# Patient Record
Sex: Female | Born: 1950 | Race: White | Hispanic: No | Smoking: Never smoker
Health system: Southern US, Community
[De-identification: ages and names within clinical notes are randomized; demographics above are authoritative.]

## PROBLEM LIST (undated history)

## (undated) DIAGNOSIS — M797 Fibromyalgia: Secondary | ICD-10-CM

## (undated) DIAGNOSIS — E559 Vitamin D deficiency, unspecified: Secondary | ICD-10-CM

## (undated) DIAGNOSIS — M1990 Unspecified osteoarthritis, unspecified site: Secondary | ICD-10-CM

## (undated) DIAGNOSIS — E039 Hypothyroidism, unspecified: Secondary | ICD-10-CM

## (undated) DIAGNOSIS — Z780 Asymptomatic menopausal state: Secondary | ICD-10-CM

## (undated) DIAGNOSIS — E8881 Metabolic syndrome: Secondary | ICD-10-CM

## (undated) DIAGNOSIS — E2740 Unspecified adrenocortical insufficiency: Secondary | ICD-10-CM

## (undated) DIAGNOSIS — T560X1A Toxic effect of lead and its compounds, accidental (unintentional), initial encounter: Secondary | ICD-10-CM

## (undated) HISTORY — DX: Vitamin D deficiency, unspecified: E55.9

## (undated) HISTORY — DX: Fibromyalgia: M79.7

## (undated) HISTORY — DX: Toxic effect of lead and its compounds, accidental (unintentional), initial encounter: T56.0X1A

## (undated) HISTORY — DX: Hypothyroidism, unspecified: E03.9

## (undated) HISTORY — DX: Unspecified adrenocortical insufficiency: E27.40

## (undated) HISTORY — DX: Unspecified osteoarthritis, unspecified site: M19.90

## (undated) HISTORY — DX: Metabolic syndrome: E88.810

## (undated) HISTORY — DX: Asymptomatic menopausal state: Z78.0

## (undated) HISTORY — DX: Metabolic syndrome: E88.81

## (undated) HISTORY — PX: OTHER SURGICAL HISTORY: SHX169

## (undated) HISTORY — PX: TOE SURGERY: SHX1073

---

## 1979-01-12 HISTORY — PX: APPENDECTOMY: SHX54

## 1988-01-12 HISTORY — PX: OTHER SURGICAL HISTORY: SHX169

## 1991-01-12 HISTORY — PX: MYOMECTOMY: SHX85

## 1998-02-17 ENCOUNTER — Other Ambulatory Visit: Admission: RE | Admit: 1998-02-17 | Discharge: 1998-02-17 | Payer: Self-pay | Admitting: Gynecology

## 1998-07-08 ENCOUNTER — Ambulatory Visit (HOSPITAL_COMMUNITY): Admission: RE | Admit: 1998-07-08 | Discharge: 1998-07-08 | Payer: Self-pay | Admitting: Gastroenterology

## 1998-07-08 ENCOUNTER — Encounter: Payer: Self-pay | Admitting: Gastroenterology

## 1999-03-09 ENCOUNTER — Other Ambulatory Visit: Admission: RE | Admit: 1999-03-09 | Discharge: 1999-03-09 | Payer: Self-pay | Admitting: Gynecology

## 2000-04-15 ENCOUNTER — Other Ambulatory Visit: Admission: RE | Admit: 2000-04-15 | Discharge: 2000-04-15 | Payer: Self-pay | Admitting: Gynecology

## 2001-07-04 ENCOUNTER — Other Ambulatory Visit: Admission: RE | Admit: 2001-07-04 | Discharge: 2001-07-04 | Payer: Self-pay | Admitting: Gynecology

## 2002-07-30 ENCOUNTER — Other Ambulatory Visit: Admission: RE | Admit: 2002-07-30 | Discharge: 2002-07-30 | Payer: Self-pay | Admitting: Gynecology

## 2003-10-10 ENCOUNTER — Other Ambulatory Visit: Admission: RE | Admit: 2003-10-10 | Discharge: 2003-10-10 | Payer: Self-pay | Admitting: Gynecology

## 2007-01-12 HISTORY — PX: TONSILLECTOMY: SUR1361

## 2007-05-08 ENCOUNTER — Telehealth: Payer: Self-pay | Admitting: Gastroenterology

## 2007-05-24 ENCOUNTER — Ambulatory Visit: Payer: Self-pay | Admitting: Gastroenterology

## 2007-06-07 ENCOUNTER — Ambulatory Visit: Payer: Self-pay | Admitting: Gastroenterology

## 2008-10-18 ENCOUNTER — Telehealth: Payer: Self-pay | Admitting: Internal Medicine

## 2008-10-22 ENCOUNTER — Ambulatory Visit: Payer: Self-pay | Admitting: Internal Medicine

## 2011-04-22 ENCOUNTER — Encounter: Payer: Self-pay | Admitting: Gastroenterology

## 2012-12-25 LAB — HM MAMMOGRAPHY: HM MAMMO: NEGATIVE

## 2013-04-05 ENCOUNTER — Encounter: Payer: Self-pay | Admitting: *Deleted

## 2013-10-12 ENCOUNTER — Encounter: Payer: Self-pay | Admitting: Gastroenterology

## 2013-11-08 ENCOUNTER — Encounter: Payer: Self-pay | Admitting: Medical

## 2013-11-08 ENCOUNTER — Ambulatory Visit (INDEPENDENT_AMBULATORY_CARE_PROVIDER_SITE_OTHER): Payer: BC Managed Care – PPO | Admitting: Medical

## 2013-11-08 VITALS — BP 118/74 | HR 82 | Temp 98.4°F | Ht 58.5 in | Wt 146.8 lb

## 2013-11-08 DIAGNOSIS — E038 Other specified hypothyroidism: Secondary | ICD-10-CM

## 2013-11-08 DIAGNOSIS — M797 Fibromyalgia: Secondary | ICD-10-CM

## 2013-11-08 DIAGNOSIS — R5382 Chronic fatigue, unspecified: Secondary | ICD-10-CM

## 2013-11-08 DIAGNOSIS — E2740 Unspecified adrenocortical insufficiency: Secondary | ICD-10-CM

## 2013-11-08 DIAGNOSIS — E039 Hypothyroidism, unspecified: Secondary | ICD-10-CM | POA: Insufficient documentation

## 2013-11-08 LAB — TSH: TSH: 0.29 u[IU]/mL — AB (ref 0.35–4.50)

## 2013-11-08 NOTE — Progress Notes (Signed)
Pre visit review using our clinic review tool, if applicable. No additional management support is needed unless otherwise documented below in the visit note. 

## 2013-11-08 NOTE — Assessment & Plan Note (Signed)
Prior dx. I asked her to sign release of info forms and give [place] names and addresses of MD so we can review labs. Pt appears stable today and reports has been feeling well.

## 2013-11-08 NOTE — Assessment & Plan Note (Signed)
Will get a tsh to establish baseline. Pt feels very well today. She is on armour thyroid.

## 2013-11-08 NOTE — Progress Notes (Signed)
Subjective:    Patient ID: Karen Proctor, female    DOB: [birth year], 63 y.o.   MRN: [id number]  HPI   Pt PMH, PSH, FH, Surgery Hx and Social HX reviewed  Pt was attending [facility] most recently. Before then some doctors who changed their practice arrangements so pt needed to find new office. Pt wants to see Dr. Randel Pigg.  Most recently pt had issue with amour thyroid difficulty getting filled. But she has script and is good with that.  No acute problems today.  Pt is about to move to the [place] 90% of the time she will be living there. She wants office that she can come to couple of times a year when she is in town. Pt husband from the [place]. Pt is leaving January 23rd and the coming back 6 months- a year.  Pt states fibromyalgia is much better since getting off of gluten.   Pt states her chronic fatigue is controlled.  Pt states last time had blood work was this past spring.  Pt does not know how or if she will be have any care in [place].  D. Talbert Nan and Sharen Heck is friends that referred her here.  Dr. Benjamine Mola. [facility] MD who pt has  longest established relationship.   . Past Medical History  Diagnosis Date  . Hypothyroidism   . Postmenopausal   . Fibromyalgia   . Metabolic syndrome   . Adrenal insufficiency   . Lead toxicity   . Vitamin D deficiency     History   Social History  . Marital Status: Married    Spouse Name: N/A    Number of Children: N/A  . Years of Education: N/A   Occupational History  . Not on file.   Social History Main Topics  . Smoking status: Never Smoker   . Smokeless tobacco: Not on file  . Alcohol Use: No  . Drug Use: No  . Sexual Activity: Not on file   Other Topics Concern  . Not on file   Social History Narrative  . No narrative on file    Past Surgical History  Procedure Laterality Date  . Tonsillectomy  2009  . Appendectomy  1981  . Other surgical history  1990   surgery for endometriosis and removal of fibroid tumor.  . Fibroid tumor      Family History  Problem Relation Age of Onset  . COPD Mother 23    No Known Allergies  Current Outpatient Prescriptions on File Prior to Visit  Medication Sig Dispense Refill  . thyroid (ARMOUR) 30 MG tablet Take 30 mg by mouth daily before breakfast.      . Arginine 1000 MG TABS Take by mouth.      . BORON PO Take by mouth.      . Coenzyme Q10 (CO Q 10) 100 MG CAPS Take by mouth.      . estradiol (VIVELLE-DOT) 0.075 MG/24HR Place 1 patch onto the skin 2 (two) times a week.      Marland Kitchen liothyronine (CYTOMEL) 5 MCG tablet Take 5 mcg by mouth daily.      Marland Kitchen POTASSIUM PO Take by mouth.      Marland Kitchen PROGESTERONE MICRONIZED PO Take 50 mg by mouth at bedtime.      . valACYclovir (VALTREX) 500 MG tablet Take 500 mg by mouth 2 (two) times daily.       No current facility-administered medications on file prior to visit.  BP 118/74  Pulse 82  Temp(Src) 98.4 F (36.9 C) (Oral)  Ht 4' 10.5" (1.486 m)  Wt 146 lb 12.8 oz (66.588 kg)  BMI 30.15 kg/m2  SpO2 100%          Review of Systems  Constitutional: Negative for fever, chills and fatigue.       Energy level good recently.  HENT: Negative.   Respiratory: Negative for cough, choking, shortness of breath and wheezing.   Cardiovascular: Negative for chest pain and palpitations.  Gastrointestinal: Negative.   Genitourinary: Negative for dysuria, urgency, frequency, flank pain, difficulty urinating and menstrual problem.  Musculoskeletal: Negative for back pain, myalgias and neck stiffness.       Muscle pains controlled recently.  Skin: Negative.   Neurological: Negative.   Hematological: Negative for adenopathy. Does not bruise/bleed easily.  Psychiatric/Behavioral: Negative.        No mood issues reported today.       Objective:   Physical Exam  Constitutional: She is oriented to person, place, and time. She appears well-developed and well-nourished.  No distress.  HENT:  Head: Normocephalic and atraumatic.  Right Ear: External ear normal.  Left Ear: External ear normal.  Nose: Nose normal.  Mouth/Throat: No oropharyngeal exudate.  Eyes: Conjunctivae and EOM are normal. Pupils are equal, round, and reactive to light.  Neck: Normal range of motion. Neck supple. No JVD present. No tracheal deviation present. No thyromegaly present.  Cardiovascular: Normal rate, regular rhythm and normal heart sounds.  Exam reveals no gallop and no friction rub.   No murmur heard. Pulmonary/Chest: Effort normal and breath sounds normal. No stridor. No respiratory distress. She has no wheezes. She has no rales. She exhibits no tenderness.  Abdominal: Soft. Bowel sounds are normal. She exhibits no distension and no mass. There is no tenderness. There is no rebound and no guarding.  Lymphadenopathy:    She has no cervical adenopathy.  Neurological: She is alert and oriented to person, place, and time. No cranial nerve deficit. Coordination normal.  Skin: She is not diaphoretic.  Psychiatric: She has a normal mood and affect. Her behavior is normal. Judgment and thought content normal.             Assessment & Plan:

## 2013-11-08 NOTE — Assessment & Plan Note (Signed)
Doing well presently.

## 2013-11-08 NOTE — Patient Instructions (Addendum)
If you could please get [place] your last labs done and we will do tsh today to get a baseline. I would recommend you establish some care in the [place].   I did  talk with Dr. Randel Pigg to confirm that we will be your local primary care provider. She agrees to get established care there([place]) as well.  I would ask that you schedule appointments in advance about 2-3 months that way you could see Dr. Randel Pigg when you are in [place].  I also did ask pt to sign release forms from prior MD so we can get all records.  I did talk with Dr. Etter Sjogren and Dr. Randel Pigg regarding logistics in patient care. They both agreed pt does need to be established in [place] as well.(And we could see her when in town)

## 2013-11-14 ENCOUNTER — Telehealth: Payer: Self-pay | Admitting: Cardiovascular Disease

## 2013-11-14 NOTE — Telephone Encounter (Signed)
Pt Signed [facility] to Have All Records Copied and Mailed to Her, She Is requesting All her records I have Faxed  The Release Over to [facility] @ [phone] ( See red Plastic folder )     11.4.15/km

## 2013-11-15 ENCOUNTER — Telehealth: Payer: Self-pay | Admitting: *Deleted

## 2013-11-15 DIAGNOSIS — M1990 Unspecified osteoarthritis, unspecified site: Secondary | ICD-10-CM

## 2013-11-15 DIAGNOSIS — E8881 Metabolic syndrome: Secondary | ICD-10-CM | POA: Insufficient documentation

## 2013-11-15 DIAGNOSIS — T560X1A Toxic effect of lead and its compounds, accidental (unintentional), initial encounter: Secondary | ICD-10-CM | POA: Insufficient documentation

## 2013-11-15 DIAGNOSIS — M2550 Pain in unspecified joint: Secondary | ICD-10-CM | POA: Insufficient documentation

## 2013-11-15 DIAGNOSIS — E7800 Pure hypercholesterolemia, unspecified: Secondary | ICD-10-CM | POA: Insufficient documentation

## 2013-11-15 DIAGNOSIS — F4311 Post-traumatic stress disorder, acute: Secondary | ICD-10-CM | POA: Insufficient documentation

## 2013-11-15 DIAGNOSIS — E618 Deficiency of other specified nutrient elements: Secondary | ICD-10-CM | POA: Insufficient documentation

## 2013-11-15 DIAGNOSIS — K900 Celiac disease: Secondary | ICD-10-CM | POA: Insufficient documentation

## 2013-11-15 DIAGNOSIS — E559 Vitamin D deficiency, unspecified: Secondary | ICD-10-CM | POA: Insufficient documentation

## 2013-11-15 DIAGNOSIS — B009 Herpesviral infection, unspecified: Secondary | ICD-10-CM | POA: Insufficient documentation

## 2013-11-15 HISTORY — DX: Unspecified osteoarthritis, unspecified site: M19.90

## 2013-11-15 NOTE — Telephone Encounter (Signed)
Medical records received via fax from [facility]. Records forwarded to [facility]. JG//CMA

## 2013-11-19 ENCOUNTER — Encounter: Payer: Self-pay | Admitting: Medical

## 2013-12-13 ENCOUNTER — Encounter: Payer: Self-pay | Admitting: Medical

## 2013-12-17 ENCOUNTER — Telehealth: Payer: Self-pay | Admitting: *Deleted

## 2013-12-17 NOTE — Telephone Encounter (Signed)
Medical records received from Dr. Benjamine Mola [facility]. Records forwarded to [facility]. JG//CMA

## 2013-12-17 NOTE — Telephone Encounter (Signed)
I have updated your immunization records. Please let us know if we can help with anything else.

## 2013-12-17 NOTE — Telephone Encounter (Signed)
Received medical records via mail from Dr. Randa Ngo [facility]). Records forwarded to [facility]. JG//CMA

## 2014-01-14 ENCOUNTER — Ambulatory Visit (INDEPENDENT_AMBULATORY_CARE_PROVIDER_SITE_OTHER): Payer: BC Managed Care – PPO

## 2014-01-14 ENCOUNTER — Telehealth: Payer: Self-pay | Admitting: Medical

## 2014-01-14 DIAGNOSIS — Z23 Encounter for immunization: Secondary | ICD-10-CM

## 2014-01-14 NOTE — Telephone Encounter (Signed)
Reviewed all of pt old records. Decided to scan all of records we received(If comes in these may be helpful). Since she expressed will be living in [place] and coming here occasionally. We have seen her one time.

## 2014-01-14 NOTE — Progress Notes (Signed)
Pt tolerated injections well. No signs of a reaction upon leaving the clinic.  

## 2014-01-14 NOTE — Progress Notes (Signed)
Pre visit review using our clinic review tool, if applicable. No additional management support is needed unless otherwise documented below in the visit note. 

## 2014-01-30 ENCOUNTER — Encounter: Payer: Self-pay | Admitting: Cardiology

## 2016-12-01 ENCOUNTER — Encounter: Payer: Self-pay | Admitting: Family Medicine

## 2016-12-01 ENCOUNTER — Other Ambulatory Visit: Payer: Self-pay

## 2016-12-01 ENCOUNTER — Ambulatory Visit (INDEPENDENT_AMBULATORY_CARE_PROVIDER_SITE_OTHER): Payer: Medicare Other | Admitting: Family Medicine

## 2016-12-01 VITALS — BP 106/70 | HR 98 | Temp 98.6°F | Resp 17 | Ht 58.5 in | Wt 136.2 lb

## 2016-12-01 DIAGNOSIS — E039 Hypothyroidism, unspecified: Secondary | ICD-10-CM | POA: Diagnosis not present

## 2016-12-01 DIAGNOSIS — Z7689 Persons encountering health services in other specified circumstances: Secondary | ICD-10-CM | POA: Diagnosis not present

## 2016-12-01 DIAGNOSIS — Z5181 Encounter for therapeutic drug level monitoring: Secondary | ICD-10-CM | POA: Diagnosis not present

## 2016-12-01 NOTE — Patient Instructions (Addendum)
     IF you received an x-ray today, you will receive an invoice from [facility]. Please contact [facility] at [phone] with questions or concerns regarding your invoice.   IF you received labwork today, you will receive an invoice from [facility]. Please contact [facility] at [phone] with questions or concerns regarding your invoice.   Our billing staff will not be able to assist you with questions regarding bills from these companies.  You will be contacted with the lab results as soon as they are available. The fastest way to get your results is to activate your My Chart account. Instructions are located on the last page of this paperwork. If you have not heard from us regarding the results in 2 weeks, please contact this office.      Hypothyroidism Hypothyroidism is a disorder of the thyroid. The thyroid is a large gland that is located in the lower front of the neck. The thyroid releases hormones that control how the body works. With hypothyroidism, the thyroid does not make enough of these hormones. What are the causes? Causes of hypothyroidism may include:  Viral infections.  Pregnancy.  Your own defense system (immune system) attacking your thyroid.  Certain medicines.  Birth defects.  Past radiation treatments to your head or neck.  Past treatment with radioactive iodine.  Past surgical removal of part or all of your thyroid.  Problems with the gland that is located in the center of your brain (pituitary). What are the signs or symptoms? Signs and symptoms of hypothyroidism may include:  Feeling as though you have no energy (lethargy).  Inability to tolerate cold.  Weight gain that is not explained by a change in diet or exercise habits.  Dry skin.  Coarse hair.  Menstrual irregularity.  Slowing of thought processes.  Constipation.  Sadness or depression. How is this diagnosed? Your health care provider may diagnose  hypothyroidism with blood tests and ultrasound tests. How is this treated? Hypothyroidism is treated with medicine that replaces the hormones that your body does not make. After you begin treatment, it may take several weeks for symptoms to go away. Follow these instructions at home:  Take medicines only as directed by your health care provider.  If you start taking any new medicines, tell your health care provider.  Keep all follow-up visits as directed by your health care provider. This is important. As your condition improves, your dosage needs may change. You will need to have blood tests regularly so that your health care provider can watch your condition. Contact a health care provider if:  Your symptoms do not get better with treatment.  You are taking thyroid replacement medicine and:  You sweat excessively.  You have tremors.  You feel anxious.  You lose weight rapidly.  You cannot tolerate heat.  You have emotional swings.  You have diarrhea.  You feel weak. Get help right away if:  You develop chest pain.  You develop an irregular heartbeat.  You develop a rapid heartbeat. This information is not intended to replace advice given to you by your health care provider. Make sure you discuss any questions you have with your health care provider. Document Released: 12/28/2004 Document Revised: 06/05/2015 Document Reviewed: 05/15/2013 Elsevier Interactive Patient Education  2017 Elsevier Inc.  

## 2016-12-01 NOTE — Progress Notes (Signed)
Chief Complaint  Patient presents with  . New Patient (Initial Visit)    establish care, pt lived in the [place], visits family months at a time, needs to talk with her about when if you will need medications or vaccines while here in the  [place]    HPI  Pt reports that she has been living in the [place] but she grew up in [place] She states that she would like to establish care here and is signed up with Medicare She reports that she is her for 1-3 months at a time Her father is 31 and his wife had a stroke and her brother is hospice with COPD  Hypothyroidism: Patient presents for evaluation of thyroid function. Symptoms consist of denies fatigue, weight changes, heat/cold intolerance, bowel/skin changes or CVS symptoms. The problem has been stable.  Previous thyroid studies include TSH. The hypothyroidism is due to hypothyroidism.   4 review of systems  Past Medical History:  Diagnosis Date  . Adrenal insufficiency ([place])   . Fibromyalgia   . Hypothyroidism   . Inflammatory arthritis 11/15/2013  . Lead toxicity   . Metabolic syndrome   . Postmenopausal   . Vitamin D deficiency     Current Outpatient Medications  Medication Sig Dispense Refill  . Cholecalciferol (D3 HIGH POTENCY) 1000 units capsule Take 1,000 Units by mouth daily.    . Folic Acid-Vit V4-UJW J19 (FOLBEE) 2.5-25-1 MG TABS tablet Take 1 tablet by mouth daily.    Marland Kitchen liothyronine (CYTOMEL) 5 MCG tablet Take 5 mcg by mouth daily.    . Multiple Vitamin (MULTIVITAMIN) tablet Take 1 tablet by mouth daily.    . Multiple Vitamins-Minerals (ZINC PO) Take 5 mg by mouth once.    . thyroid (ARMOUR) 30 MG tablet Take 30 mg by mouth daily before breakfast.    . Arginine 1000 MG TABS Take by mouth.    Marland Kitchen aspirin 81 MG chewable tablet Chew by mouth daily.    Marland Kitchen BORON PO Take by mouth.    . Coenzyme Q10 (CO Q 10) 100 MG CAPS Take by mouth.    . estradiol (VIVELLE-DOT) 0.075 MG/24HR Place 1 patch onto the skin 2  (two) times a week.    Marland Kitchen POTASSIUM PO Take by mouth.    Marland Kitchen PROGESTERONE MICRONIZED PO Take 50 mg by mouth at bedtime.    . valACYclovir (VALTREX) 500 MG tablet Take 500 mg by mouth 2 (two) times daily.     No current facility-administered medications for this visit.     Allergies: No Known Allergies  Past Surgical History:  Procedure Laterality Date  . APPENDECTOMY  1981  . fibroid tumor    . MYOMECTOMY  1993  . OTHER SURGICAL HISTORY  1990   surgery for endometriosis and removal of fibroid tumor.  . TONSILLECTOMY  2009    Social History   Socioeconomic History  . Marital status: Married    Spouse name: None  . Number of children: None  . Years of education: None  . Highest education level: None  Social Needs  . Financial resource strain: None  . Food insecurity - worry: None  . Food insecurity - inability: None  . Transportation needs - medical: None  . Transportation needs - non-medical: None  Occupational History  . Occupation: Retired  Tobacco Use  . Smoking status: Never Smoker  . Smokeless tobacco: Never Used  Substance and Sexual Activity  . Alcohol use: Yes    Alcohol/week: 0.0 oz  Comment: very occasionally  . Drug use: No  . Sexual activity: None  Other Topics Concern  . None  Social History Narrative  . None    Family History  Problem Relation Age of Onset  . COPD Mother 77  . Skin cancer Father      ROS Review of Systems See HPI Constitution: No fevers or chills No malaise No diaphoresis Skin: No rash or itching Eyes: no blurry vision, no double vision GU: no dysuria or hematuria Neuro: no dizziness or headaches all others reviewed and negative   Objective: Vitals:   12/01/16 1510  BP: 106/70  Pulse: 98  Resp: 17  Temp: 98.6 F (37 C)  SpO2: 98%  Weight: 136 lb 3.2 oz (61.8 kg)  Height: 4' 10.5" (1.486 m)    Physical Exam  Constitutional: She is oriented to person, place, and time. She appears well-developed and  well-nourished.  HENT:  Head: Normocephalic and atraumatic.  Neck: Normal range of motion. No thyromegaly present.  Cardiovascular: Normal rate, regular rhythm and normal heart sounds.  No murmur heard. Pulmonary/Chest: Effort normal and breath sounds normal. No respiratory distress.  Neurological: She is alert and oriented to person, place, and time.  Reflex Scores:      Patellar reflexes are 2+ on the right side and 2+ on the left side. Skin: Skin is warm. Capillary refill takes less than 2 seconds.  Psychiatric: She has a normal mood and affect. Her behavior is normal. Judgment and thought content normal.    Assessment and Plan Jalesa was seen today for new patient (initial visit).  Diagnoses and all orders for this visit:  Acquired hypothyroidism- will check levels Will refill based on levels Stable currently -     Comprehensive metabolic panel -     TSH + free T4  Encounter to establish care with new doctor -     Comprehensive metabolic panel -     TSH + free T4  Encounter for medication monitoring- will check levels     Zoe A Nolon Rod

## 2016-12-02 LAB — COMPREHENSIVE METABOLIC PANEL
ALK PHOS: 80 IU/L (ref 39–117)
ALT: 18 IU/L (ref 0–32)
AST: 18 IU/L (ref 0–40)
Albumin/Globulin Ratio: 1.9 (ref 1.2–2.2)
Albumin: 4.5 g/dL (ref 3.6–4.8)
BUN/Creatinine Ratio: 20 (ref 12–28)
BUN: 16 mg/dL (ref 8–27)
Bilirubin Total: 0.3 mg/dL (ref 0.0–1.2)
CO2: 24 mmol/L (ref 20–29)
Calcium: 9.5 mg/dL (ref 8.7–10.3)
Chloride: 103 mmol/L (ref 96–106)
Creatinine, Ser: 0.79 mg/dL (ref 0.57–1.00)
GFR calc Af Amer: 91 mL/min/{1.73_m2} (ref >59)
GFR calc non Af Amer: 79 mL/min/{1.73_m2} (ref >59)
GLOBULIN, TOTAL: 2.4 g/dL (ref 1.5–4.5)
Glucose: 91 mg/dL (ref 65–99)
Potassium: 4.3 mmol/L (ref 3.5–5.2)
Sodium: 142 mmol/L (ref 134–144)
Total Protein: 6.9 g/dL (ref 6.0–8.5)

## 2016-12-02 LAB — TSH+FREE T4
FREE T4: 1.55 ng/dL (ref 0.82–1.77)
TSH: 0.061 u[IU]/mL — AB (ref 0.450–4.500)

## 2016-12-03 ENCOUNTER — Telehealth: Payer: Self-pay | Admitting: Family Medicine

## 2016-12-03 DIAGNOSIS — E0580 Other thyrotoxicosis without thyrotoxic crisis or storm: Secondary | ICD-10-CM

## 2016-12-03 NOTE — Telephone Encounter (Signed)
Patient reports that her travel plans are subject to change ans so she will be in [place] longer but uncertain when she will return to the [place]. Patient made aware of her low TSH  She is on Levothyroxine and Cytomel She has Endocrinologist in the [place].  Referred her to Endocrinologist locally.

## 2016-12-03 NOTE — Telephone Encounter (Signed)
Spoke with patient- she wants to know if her dose is changing and to make sure that she can get a 1 year supply because she travels to [place]. [facility] uses [facility] [address].

## 2016-12-03 NOTE — Telephone Encounter (Signed)
Copied from [place]. Topic: Inquiry >> Dec 03, 2016  9:54 AM Karen Proctor, [place] wrote: Reason for CRM: Patient called to check on her lab work that she had done. Patient also had a question about her year supply of her Liothyronine medication that she had spoken with the doctor about. If someone could give her a call back at [phone] (current cell number)

## 2016-12-06 NOTE — Telephone Encounter (Signed)
Addressed in a separate phone note.

## 2016-12-06 NOTE — Telephone Encounter (Signed)
Please advise 

## 2016-12-13 ENCOUNTER — Ambulatory Visit: Payer: Medicare Other

## 2017-01-31 ENCOUNTER — Ambulatory Visit: Payer: BC Managed Care – PPO | Admitting: Endocrinology

## 2017-03-03 DIAGNOSIS — Z23 Encounter for immunization: Secondary | ICD-10-CM | POA: Diagnosis not present

## 2017-05-15 ENCOUNTER — Encounter: Payer: Self-pay | Admitting: Gastroenterology

## 2017-09-29 DIAGNOSIS — Z23 Encounter for immunization: Secondary | ICD-10-CM | POA: Diagnosis not present

## 2017-10-06 ENCOUNTER — Telehealth: Payer: Self-pay

## 2017-10-06 NOTE — Telephone Encounter (Signed)
Per Dr. Charlett Blake she is currently not accepting new patients at this time she can recommend Dr. Lorelei Pont is accepting new patients

## 2017-10-06 NOTE — Telephone Encounter (Signed)
Copied from [place] [id number]. Topic: Inquiry >> Oct 06, 2017  9:28 AM Sheran Luz wrote: Reason for CRM: Karen Proctor is requesting Dr. Charlett Blake accept her as a new pt if possible stating that she has had friends who recommended her. Advised pt that Dr. Charlett Blake is not approving new patients at this time but pt wanted to note that she lives part time in [place] and comes to [place] for 4-6 months at a time. Pt cannot bring her medication for Hypothyroidism, as it needs to be refrigerated, so pt states that she needs a "part time pcp" in [place] and that her doctor in the [place] is willing to [employer] notes. Please advise.   Cb# [phone]

## 2017-10-07 NOTE — Telephone Encounter (Signed)
Pt knew Dr. Charlett Blake wasn't accepting patients and said she was a previous patient of Mackie Pai and wanted to reestablish care with him. Scheduled appt 10/11/17 @ 10:40.

## 2017-10-11 ENCOUNTER — Encounter: Payer: Self-pay | Admitting: Gastroenterology

## 2017-10-11 ENCOUNTER — Encounter: Payer: Self-pay | Admitting: Medical

## 2017-10-11 ENCOUNTER — Ambulatory Visit (INDEPENDENT_AMBULATORY_CARE_PROVIDER_SITE_OTHER): Payer: Medicare Other | Admitting: Medical

## 2017-10-11 VITALS — BP 130/80 | HR 69 | Temp 97.8°F | Resp 16 | Ht 58.5 in | Wt 143.2 lb

## 2017-10-11 DIAGNOSIS — E039 Hypothyroidism, unspecified: Secondary | ICD-10-CM

## 2017-10-11 DIAGNOSIS — Z1211 Encounter for screening for malignant neoplasm of colon: Secondary | ICD-10-CM

## 2017-10-11 LAB — T4, FREE: FREE T4: 1.13 ng/dL (ref 0.60–1.60)

## 2017-10-11 LAB — TSH: TSH: 0.04 u[IU]/mL — ABNORMAL LOW (ref 0.35–4.50)

## 2017-10-11 MED ORDER — CYCLOBENZAPRINE HCL 5 MG PO TABS: 5.0000 mg | ORAL_TABLET | Freq: Three times a day (TID) | ORAL | 0 refills | Status: DC | PRN

## 2017-10-11 NOTE — Patient Instructions (Addendum)
For history of hypothyroidism, I am going to repeat her TSH and T4 level.  Knowing these will allow me to determine what is the most appropriate course of action in terms of medication dosage.  It is helpful that she brought lab work that was done in the [place] and the letter from your endocrinologist.  If some abnormal values will touch base with supervising physicians to get their opinion.  I do plan to touch base with you by tomorrow afternoon or at the latest by Thursday.  I typically labs will come back within 24 hours.  Follow-up date to be determined based on lab work.  Also at your request did go ahead and make a GI referral for screening colonoscopy.

## 2017-10-11 NOTE — Progress Notes (Addendum)
Subjective:    Patient ID: Karen Proctor, female    DOB: [birth year], 67 y.o.   MRN: [id number]  HPI  Pt in to get re-established/new patient appointment. Last seen by myself in Oct 2015. She has been seeing 2 MD over past 4 years.(LOS will be new pt)  Pt has been in [place] for about 3 years.   Pt is on both synthroid and cytomel. Pt states that recent former pcp told her she was hyperthyroid or over supplemented. Pt sees endocrinologist in [place]  who typed letter explaining she is hypothyroid.We have some copies of former labs  Recent former pcp wanted to refer her to specialist in [place]. So she left and came here. Former pt here about 3 years ago. I only saw her one time but she has  MD's as her pcp in [place] since.  Pt lives in [place] primarily but at times can live in [place] up to 6 months( for example when her brother became ill). Pt may be here for only 2 months but if her dad or mom health condition worsens then she may stay much longer.  Pt states in [place] she only has one form of cytomel that needs to refrigerated.  She is using  currently old generic liothyronine that was former pcp in [place] had given.   Pt was seeing Dr. Deirdre Pippins who wrote prior prescription cytomel.(pt takes 5 mcg daily) but she wrote script that stated  twice a day so she won't run out of the cytomel. She explains if has to travel the refrrigerated version becomes useless.   Pt states only takes both thyroid med 5 days a week.  Pt will be here in [place] until November.   Review of Systems  Constitutional: Negative for activity change, chills and fatigue.  Respiratory: Negative for shortness of breath.   Cardiovascular: Negative for chest pain and palpitations.  Gastrointestinal: Negative for abdominal pain, constipation, diarrhea, nausea and vomiting.  Endocrine: Negative for polydipsia, polyphagia and polyuria.  Musculoskeletal: Negative for back pain, myalgias and neck stiffness.  Skin:  Negative for pallor.  Hematological: Negative for adenopathy. Does not bruise/bleed easily.  Psychiatric/Behavioral: Negative for behavioral problems, confusion and suicidal ideas. The patient is nervous/anxious.        Appears nervous and stressed about her thyroid issue.     Past Medical History:  Diagnosis Date  . Adrenal insufficiency ([place])   . Fibromyalgia   . Hypothyroidism   . Inflammatory arthritis 11/15/2013  . Lead toxicity   . Metabolic syndrome   . Postmenopausal   . Vitamin D deficiency      Social History   Socioeconomic History  . Marital status: Married    Spouse name: Not on file  . Number of children: Not on file  . Years of education: Not on file  . Highest education level: Not on file  Occupational History  . Occupation: Retired  Scientific laboratory technician  . Financial resource strain: Not on file  . Food insecurity:    Worry: Not on file    Inability: Not on file  . Transportation needs:    Medical: Not on file    Non-medical: Not on file  Tobacco Use  . Smoking status: Never Smoker  . Smokeless tobacco: Never Used  Substance and Sexual Activity  . Alcohol use: Yes    Alcohol/week: 0.0 standard drinks    Comment: very occasionally  . Drug use: No  . Sexual activity: Not on file  Lifestyle  .  Physical activity:    Days per week: Not on file    Minutes per session: Not on file  . Stress: Not on file  Relationships  . Social connections:    Talks on phone: Not on file    Gets together: Not on file    Attends religious service: Not on file    Active member of club or organization: Not on file    Attends meetings of clubs or organizations: Not on file    Relationship status: Not on file  . Intimate partner violence:    Fear of current or ex partner: Not on file    Emotionally abused: Not on file    Physically abused: Not on file    Forced sexual activity: Not on file  Other Topics Concern  . Not on file  Social History Narrative  . Not on file     Past Surgical History:  Procedure Laterality Date  . APPENDECTOMY  1981  . fibroid tumor    . MYOMECTOMY  1993  . OTHER SURGICAL HISTORY  1990   surgery for endometriosis and removal of fibroid tumor.  . TONSILLECTOMY  2009    Family History  Problem Relation Age of Onset  . COPD Mother 46  . Skin cancer Father     No Known Allergies  Current Outpatient Medications on File Prior to Visit  Medication Sig Dispense Refill  . Arginine 1000 MG TABS Take by mouth.    Marland Kitchen aspirin 81 MG chewable tablet Chew by mouth daily.    Marland Kitchen levothyroxine (SYNTHROID, LEVOTHROID) 100 MCG tablet Take 100 mcg by mouth daily before breakfast.    . BORON PO Take by mouth.    . Cholecalciferol (D3 HIGH POTENCY) 1000 units capsule Take 1,000 Units by mouth daily.    . Coenzyme Q10 (CO Q 10) 100 MG CAPS Take by mouth.    . liothyronine (CYTOMEL) 5 MCG tablet Take 5 mcg by mouth daily.    . Multiple Vitamin (MULTIVITAMIN) tablet Take 1 tablet by mouth daily.    . Multiple Vitamins-Minerals (ZINC PO) Take 5 mg by mouth once.    Marland Kitchen POTASSIUM PO Take by mouth.    Marland Kitchen PROGESTERONE MICRONIZED PO Take 50 mg by mouth at bedtime.    . valACYclovir (VALTREX) 500 MG tablet Take 500 mg by mouth 2 (two) times daily.     No current facility-administered medications on file prior to visit.     BP (!) 148/83   Pulse 69   Temp 97.8 F (36.6 C) (Oral)   Resp 16   Ht 4' 10.5" (1.486 m)   Wt 143 lb 3.2 oz (65 kg)   SpO2 100%   BMI 29.42 kg/m       Objective:   Physical Exam  General Mental Status- Alert. General Appearance- Not in acute distress.     Neck Carotid Arteries- Normal color. Moisture- Normal Moisturre. No JVD. No thyromegaly.  Chest and Lung Exam Auscultation: Breath Sounds:-Normal.  Cardiovascular Auscultation:Rythm- Regular. Murmurs & Other Heart Sounds:Auscultation of the heart reveals- No Murmurs.  Neurologic Cranial Nerve exam:- CN III-XII intact(No nystagmus), symmetric  smile. Strength:- 5/5 equal and symmetric strength both upper and lower extremities.      Assessment & Plan:  For history of hypothyroidism, I am going to repeat her TSH and T4 level.  Knowing these will allow me to determine what is the most appropriate course of action in terms of medication dosage.  It is helpful that  she brought lab work that was done in the [place] and the letter from your endocrinologist.  If some abnormal values will touch base with supervising physicians to get their opinion.  I do plan to touch base with you by tomorrow afternoon or at the latest by Thursday.  I typically labs will come back within 24 hours.  Follow-up date to be determined based on lab work.  Also at your request did go ahead and make a GI referral for screening colonoscopy.  Mackie Pai, PA-C

## 2017-10-12 ENCOUNTER — Other Ambulatory Visit: Payer: Self-pay | Admitting: Family Medicine

## 2017-10-12 DIAGNOSIS — R7989 Other specified abnormal findings of blood chemistry: Secondary | ICD-10-CM

## 2017-10-13 ENCOUNTER — Telehealth: Payer: Self-pay | Admitting: Medical

## 2017-10-13 ENCOUNTER — Telehealth: Payer: Self-pay | Admitting: Family Medicine

## 2017-10-13 NOTE — Telephone Encounter (Signed)
Copied from [place] [id number]. Topic: Quick Communication - See Telephone Encounter >> Oct 13, 2017  2:11 PM Rutherford Nail, [place] wrote: CRM for notification. See Telephone encounter for: 10/13/17. Patient requesting a call back from [facility] regarding lab results and referral for endocrinology. States that endocrinology is backed up until 01/2018 and she will not be in the [place] that long. Would like to see if anyone else has anything sooner. Please advise. CB#:[phone]

## 2017-10-13 NOTE — Telephone Encounter (Signed)
Let patient know due to having to see paitent in office I am not sure when I will be able to call her. I have sent her the results via my chart. I did get a chance to talk with Dr. Charlett Blake and she is going to review my note and labs. Then give her opinion. If she also agrees with referral will go ahead and make the referral. If we do make referral then will ask staff to call around and get quickest appointment.

## 2017-10-13 NOTE — Telephone Encounter (Signed)
Made note on canceling the flexeril rx that jasmine rx'd. I advised her to cancel the rx. Told patient not to fill the rx. Explained the mistake patient and advised not to fill and jasmine is canceling the rx.

## 2017-10-14 ENCOUNTER — Telehealth: Payer: Self-pay | Admitting: Medical

## 2017-10-14 NOTE — Telephone Encounter (Signed)
Pt notified. Pt states she would greatly appreciate someone helping her out with her Thyroid problem. Pt expressed concerns about being between two different  medical systems. Pt states she doe not know how long she will be in [place].

## 2017-10-14 NOTE — Telephone Encounter (Signed)
Author called pt. to relay Edward's recent note. Pt. Stated she "already talked to [facility]" about the referral. Pt. Is concerned about her thyroid, and would need to be seen by endocrinologist before mid-November when she plans to be out of the country. Routed to [place] and [place], [place].

## 2017-10-14 NOTE — Telephone Encounter (Signed)
Pt has adequate supply for cytomel presently. She may not need refill any time soon. She will stay on same regimen presently.    Pt is ok with [place] trying to find local endocrinologist.

## 2017-11-01 ENCOUNTER — Other Ambulatory Visit: Payer: Self-pay

## 2017-11-01 ENCOUNTER — Ambulatory Visit (AMBULATORY_SURGERY_CENTER): Payer: Self-pay

## 2017-11-01 VITALS — Ht 59.0 in | Wt 142.2 lb

## 2017-11-01 DIAGNOSIS — Z1211 Encounter for screening for malignant neoplasm of colon: Secondary | ICD-10-CM

## 2017-11-01 NOTE — Progress Notes (Signed)
Pt verbalize allergies to soy products No issues with past sedation with any surgeries  or procedures, no intubation problems  No diet pills per patient No home 02 use per patient  No blood thinners per patient  Pt denies issues with constipation  No A fib or A flutter  EMMI video sent to pt's e mail

## 2017-11-14 ENCOUNTER — Encounter: Payer: Self-pay | Admitting: Endocrinology

## 2017-11-15 ENCOUNTER — Encounter: Payer: Self-pay | Admitting: Gastroenterology

## 2017-11-15 ENCOUNTER — Ambulatory Visit (AMBULATORY_SURGERY_CENTER): Payer: Medicare Other | Admitting: Gastroenterology

## 2017-11-15 VITALS — BP 110/56 | HR 91 | Temp 97.8°F | Resp 12 | Ht <= 58 in | Wt 143.0 lb

## 2017-11-15 DIAGNOSIS — Z1211 Encounter for screening for malignant neoplasm of colon: Secondary | ICD-10-CM | POA: Diagnosis not present

## 2017-11-15 DIAGNOSIS — D120 Benign neoplasm of cecum: Secondary | ICD-10-CM | POA: Diagnosis not present

## 2017-11-15 DIAGNOSIS — D123 Benign neoplasm of transverse colon: Secondary | ICD-10-CM

## 2017-11-15 MED ORDER — SODIUM CHLORIDE 0.9 % IV SOLN: 500.0000 mL | Freq: Once | INTRAVENOUS | Status: DC

## 2017-11-15 NOTE — Progress Notes (Signed)
To PACU, VSS. Report to [email or web address] 

## 2017-11-15 NOTE — Op Note (Signed)
[facility] Patient Name: Karen Proctor Procedure Date: 11/15/2017 12:57 PM MRN: [id number] Endoscopist: Mauri Pole , MD Age: 67 Referring MD:  Date of Birth: 1950-08-13 Gender: Female Account #: [id number] Procedure:                Colonoscopy Indications:              Screening for colorectal malignant neoplasm, Last                            colonoscopy: 2009 Medicines:                Monitored Anesthesia Care Procedure:                Pre-Anesthesia Assessment:                           - Prior to the procedure, a History and Physical                            was performed, and patient medications and                            allergies were reviewed. The patient's tolerance of                            previous anesthesia was also reviewed. The risks                            and benefits of the procedure and the sedation                            options and risks were discussed with the patient.                            All questions were answered, and informed consent                            was obtained. Prior Anticoagulants: The patient has                            taken no previous anticoagulant or antiplatelet                            agents. ASA Grade Assessment: II - A patient with                            mild systemic disease. After reviewing the risks                            and benefits, the patient was deemed in                            satisfactory condition to undergo the procedure.  After obtaining informed consent, the colonoscope                            was passed under direct vision. Throughout the                            procedure, the patient's blood pressure, pulse, and                            oxygen saturations were monitored continuously. The                            Colonoscope was introduced through the anus and                            advanced to the the cecum, identified  by                            appendiceal orifice and ileocecal valve. The                            colonoscopy was performed without difficulty. The                            patient tolerated the procedure well. The quality                            of the bowel preparation was excellent. The                            ileocecal valve, appendiceal orifice, and rectum                            were photographed. Scope In: 1:00:51 PM Scope Out: 1:23:18 PM Scope Withdrawal Time: 0 hours 14 minutes 32 seconds  Total Procedure Duration: 0 hours 22 minutes 27 seconds  Findings:                 The perianal and digital rectal examinations were                            normal.                           A 1 mm polyp was found in the cecum. The polyp was                            sessile. The polyp was removed with a cold biopsy                            forceps. Resection and retrieval were complete.                           A 4 mm polyp was found in the transverse colon. The  polyp was sessile. The polyp was removed with a                            cold snare. Resection and retrieval were complete.                           Non-bleeding internal hemorrhoids were found during                            retroflexion. The hemorrhoids were small.                           A few small-mouthed diverticula were found in the                            sigmoid colon. Complications:            No immediate complications. Estimated Blood Loss:     Estimated blood loss was minimal. Impression:               - One 1 mm polyp in the cecum, removed with a cold                            biopsy forceps. Resected and retrieved.                           - One 4 mm polyp in the transverse colon, removed                            with a cold snare. Resected and retrieved.                           - Non-bleeding internal hemorrhoids.                           -  Diverticulosis in the sigmoid colon. Recommendation:           - Patient has a contact number available for                            emergencies. The signs and symptoms of potential                            delayed complications were discussed with the                            patient. Return to normal activities tomorrow.                            Written discharge instructions were provided to the                            patient.                           - Resume previous diet.                           -  Continue present medications.                           - Await pathology results.                           - Repeat colonoscopy in 5-10 years for surveillance                            based on pathology results. Mauri Pole, MD 11/15/2017 1:33:41 PM This report has been signed electronically.

## 2017-11-15 NOTE — Patient Instructions (Signed)
YOU HAD AN ENDOSCOPIC PROCEDURE TODAY AT THE [facility]:   Refer to the procedure report that was given to you for any specific questions about what was found during the examination.  If the procedure report does not answer your questions, please call your gastroenterologist to clarify.  If you requested that your care partner not be given the details of your procedure findings, then the procedure report has been included in a sealed envelope for you to review at your convenience later.  YOU SHOULD EXPECT: Some feelings of bloating in the abdomen. Passage of more gas than usual.  Walking can help get rid of the air that was put into your GI tract during the procedure and reduce the bloating. If you had a lower endoscopy (such as a colonoscopy or flexible sigmoidoscopy) you may notice spotting of blood in your stool or on the toilet paper. If you underwent a bowel prep for your procedure, you may not have a normal bowel movement for a few days.  Please Note:  You might notice some irritation and congestion in your nose or some drainage.  This is from the oxygen used during your procedure.  There is no need for concern and it should clear up in a day or so.  SYMPTOMS TO REPORT IMMEDIATELY:   Following lower endoscopy (colonoscopy or flexible sigmoidoscopy):  Excessive amounts of blood in the stool  Significant tenderness or worsening of abdominal pains  Swelling of the abdomen that is new, acute  Fever of 100F or higher   For urgent or emergent issues, a gastroenterologist can be reached at any hour by calling [phone].   DIET:  We do recommend a small meal at first, but then you may proceed to your regular diet.  Drink plenty of fluids but you should avoid alcoholic beverages for 24 hours. Try to increase the fiber in your diet, and drink plenty of water.  ACTIVITY:  You should plan to take it easy for the rest of today and you should NOT DRIVE or use heavy machinery until  tomorrow (because of the sedation medicines used during the test).    FOLLOW UP: Our staff will call the number listed on your records the next business day following your procedure to check on you and address any questions or concerns that you may have regarding the information given to you following your procedure. If we do not reach you, we will leave a message.  However, if you are feeling well and you are not experiencing any problems, there is no need to return our call.  We will assume that you have returned to your regular daily activities without incident.  If any biopsies were taken you will be contacted by phone or by letter within the next 1-3 weeks.  Please call us at [phone] if you have not heard about the biopsies in 3 weeks.    SIGNATURES/CONFIDENTIALITY: You and/or your care partner have signed paperwork which will be entered into your electronic medical record.  These signatures attest to the fact that that the information above on your After Visit Summary has been reviewed and is understood.  Full responsibility of the confidentiality of this discharge information lies with you and/or your care-partner.  Read all of the handouts given to you by your recovery room nurse. 

## 2017-11-15 NOTE — Progress Notes (Signed)
Called to room to assist during endoscopic procedure.  Patient ID and intended procedure confirmed with present staff. Received instructions for my participation in the procedure from the performing physician.  

## 2017-11-16 ENCOUNTER — Telehealth: Payer: Self-pay | Admitting: *Deleted

## 2017-11-16 NOTE — Telephone Encounter (Signed)
  Follow up Call-  Call back number 11/15/2017  Post procedure Call Back phone  # [phone]  Permission to leave phone message Yes  Some recent data might be hidden     Patient questions:  Do you have a fever, pain , or abdominal swelling? No. Pain Score  0 *  Have you tolerated food without any problems? Yes.    Have you been able to return to your normal activities? Yes.    Do you have any questions about your discharge instructions: Diet   No. Medications  No. Follow up visit  No.  Do you have questions or concerns about your Care? No.  Actions: * If pain score is 4 or above: No action needed, pain <4.

## 2017-11-22 ENCOUNTER — Encounter: Payer: Self-pay | Admitting: Gastroenterology

## 2017-11-22 ENCOUNTER — Telehealth: Payer: Self-pay

## 2017-11-22 NOTE — Telephone Encounter (Signed)
Agent Abbi states pt. Wants to discuss colonoscopy results. Agent will forward the request to provider.

## 2017-11-30 ENCOUNTER — Telehealth: Payer: Self-pay | Admitting: Gastroenterology

## 2017-11-30 NOTE — Telephone Encounter (Signed)
Spoke with the patient. Many many questions about her biopsy results, the anatomy of the colon, the location of the polyp and the method of recall for the next colonoscopy. She also wanted to know about insurance coverage. Answered question. Advised her I do not know what her insurance will or will not cover when the time comes. Requests a copy of the pathology report. Mailing it out today. Confirmed her address.

## 2017-12-01 DIAGNOSIS — Z124 Encounter for screening for malignant neoplasm of cervix: Secondary | ICD-10-CM | POA: Diagnosis not present

## 2017-12-01 DIAGNOSIS — Z01419 Encounter for gynecological examination (general) (routine) without abnormal findings: Secondary | ICD-10-CM | POA: Diagnosis not present

## 2017-12-02 ENCOUNTER — Encounter: Payer: Self-pay | Admitting: Medical

## 2017-12-02 NOTE — Progress Notes (Unsigned)
Patient requested copy of labs and after visit summary  for 10/11/17 office visit. Mailed out today.

## 2017-12-11 NOTE — Progress Notes (Addendum)
Patient ID: Karen Proctor, female   DOB: 11-11-1950, 67 y.o.   MRN: [id number]           Referring Provider: Mackie Pai, PA-C  Reason for Appointment:  Hypothyroidism, new visit    History of Present Illness:   Hypothyroidism was first diagnosed in 2004  At the time of diagnosis patient was having symptoms of significant fatigue but does not remember any other symptoms typical of hypothyroidism She was also having generalized aching symptoms She was seen by a holistic physician who told her that she was hypothyroid Not clear if she had a baseline thyroid enlargement or abnormal antibody studies  She was treated with Armour Thyroid initially with improvement in her thyroid symptoms Subsequently she was also tried by another physician on levothyroxine but she felt more tired with this She thinks that for the last 8 years or so she has been taking a combination of levothyroxine and Cytomel Apparently has not had much change in her dosage for some time The patient takes the thyroid supplements before breakfast  The patient is partly followed in the [place] where she lives most of the time by a local endocrinologist  Although she thinks her energy level is generally fairly good her thyroid levels have been somewhat inconsistent She recently does not complain of any shakiness, anxiety, palpitations, unusual weight loss or heat intolerance However in the past she has had some point being given excessive thyroid hormone doses causing palpitations and chest tightness  Review of her TSH levels done in the [place] since 2018 indicate that TSH level has been low normal or low ranging between 0.11 and 0.54 TSH was also low in 8/19 but her treatment regimen was not changed Total T3 levels have been usually admit normal with T4 (?  Free)  levels upper normal  She now says that she was told in the [place] that the Cytomel she takes needs to be refrigerated However the  prescriptions he gets from here which is the usual liothyronine that she keeps at room temperature  Because of her TSH being significantly suppressed in 10/2017 she has been referred here for further management         Patient's weight history is as follows:  Wt Readings from Last 3 Encounters:  12/12/17 147 lb 3.2 oz (66.8 kg)  11/15/17 143 lb (64.9 kg)  11/01/17 142 lb 3.2 oz (64.5 kg)    Thyroid function results have been as follows:  Lab Results  Component Value Date   TSH 0.04 (L) 10/11/2017   TSH 0.061 (L) 12/01/2016   TSH 0.29 (L) 11/08/2013   FREET4 1.13 10/11/2017   FREET4 1.55 12/01/2016   No results found for: T3FREE    Past Medical History:  Diagnosis Date  . Adrenal insufficiency ([place])   . Fibromyalgia   . Hypothyroidism   . Inflammatory arthritis 11/15/2013  . Lead toxicity   . Metabolic syndrome   . Postmenopausal   . Vitamin D deficiency     Past Surgical History:  Procedure Laterality Date  . APPENDECTOMY  1981  . fibroid tumor    . MYOMECTOMY  1993  . OTHER SURGICAL HISTORY  1990   surgery for endometriosis and removal of fibroid tumor.  . TOE SURGERY    . TONSILLECTOMY  2009    Family History  Problem Relation Age of Onset  . COPD Mother 75  . Skin cancer Father   . Colon cancer Neg Hx   . Esophageal cancer  Neg Hx   . Rectal cancer Neg Hx   . Stomach cancer Neg Hx   . Thyroid disease Neg Hx     Social History:  reports that she has never smoked. She has never used smokeless tobacco. She reports that she drinks alcohol. She reports that she does not use drugs.  Allergies:  Allergies  Allergen Reactions  . Gluten Meal     Allergies as of 12/12/2017      Reactions   Gluten Meal       Medication List        Accurate as of 12/12/17  9:17 PM. Always use your most recent med list.          aspirin 81 MG chewable tablet Chew by mouth daily.   levothyroxine 100 MCG tablet Commonly known as:  SYNTHROID, LEVOTHROID Take 100 mcg  by mouth daily before breakfast.   liothyronine 5 MCG tablet Commonly known as:  CYTOMEL Take 5 mcg by mouth daily.          Review of Systems  Constitutional: Negative for weight loss.       Has occasional sweats at night   HENT: Negative for hoarseness and trouble swallowing.   Respiratory: Negative for daytime sleepiness.   Cardiovascular: Negative for palpitations.  Gastrointestinal: Negative for diarrhea.       She has been told to have gluten intolerance and avoid gluten products  Endocrine: Negative for fatigue.  Genitourinary: Negative for frequency.  Musculoskeletal:       Some muscle aches present, no joint pain.  She reportedly has fibromyalgia, currently not on any specific treatment  Skin:       Periodically has had hair loss, not recently.  Make a dry skin and winter will get dry skin in winter but usually in a warm indoor environment  Neurological: Negative for weakness.  Psychiatric/Behavioral: Negative for nervousness.                Examination:    BP 126/70 (BP Location: Left Arm, Patient Position: Sitting, Cuff Size: Normal)   Pulse 91   Ht 4\' 10"  (1.473 m)   Wt 147 lb 3.2 oz (66.8 kg)   SpO2 98%   BMI 30.76 kg/m   GENERAL:  Average build.   No pallor.    Skin:  no rash or skin lesions   EYES:  No prominence of the eyes or swelling of the eyelids  ENT: Oral mucosa and tongue normal.  NECK: No lymphadenopathy  THYROID:  Not palpable.  HEART:  Normal  S1 and S2; no murmur or click.  CHEST:    Lungs: Vescicular breath sounds heard equally.  No crepitations/ wheeze.  ABDOMEN:  No distention.  Liver and spleen not palpable.  No other mass or tenderness.  NEUROLOGICAL: Reflexes are bilaterally slightly brisk at biceps but normal at ankles.  EXTREMITIES:  Normal peripheral joints.  No ankle edema present   Assessment:  HYPOTHYROIDISM, long-standing and presumably primary No associated goiter at present She has been treated  long-term with the combination of levothyroxine and liothyronine with good results and improvement of her symptoms particularly fatigue She reports not feeling as good with levothyroxine alone She takes generic levothyroxine and liothyronine although may be getting a different manufacturer in [place] where she lives most of the time Also discussed that there is no need to refrigerate liothyronine or Cytomel and not clear why she was told to do this in [place]  Although she is subjectively  doing well and is not having any current symptoms of hyperthyroidism or hypothyroidism and her exam indicates she is euthyroid she tends to have low TSH levels   PLAN:   Discussed with the patient that long-term excessive suppression of TSH would be detrimental for bone health and increased risk for atrial arrhythmias Will need to recheck her thyroid levels including free T4 and free T3  If her TSH is again low along with normal T3 will need to reduce the dose of her levothyroxine She will come for her labs on Friday since she has not taken her medications this morning  Follow-up to be determined   Elayne Snare 12/12/2017, 9:17 PM   Consultation note copy sent to the PCP  Note: This office note was prepared with Dragon voice recognition system technology. Any transcriptional errors that result from this process are unintentional.  Addendum: All labs ok, no change in Rx needed  Lab Results  Component Value Date   TSH 1.07 12/15/2017     12/15/17

## 2017-12-12 ENCOUNTER — Encounter: Payer: Self-pay | Admitting: Endocrinology

## 2017-12-12 ENCOUNTER — Ambulatory Visit (INDEPENDENT_AMBULATORY_CARE_PROVIDER_SITE_OTHER): Payer: Medicare Other | Admitting: Endocrinology

## 2017-12-12 VITALS — BP 126/70 | HR 91 | Ht <= 58 in | Wt 147.2 lb

## 2017-12-12 DIAGNOSIS — E039 Hypothyroidism, unspecified: Secondary | ICD-10-CM

## 2017-12-15 ENCOUNTER — Other Ambulatory Visit (INDEPENDENT_AMBULATORY_CARE_PROVIDER_SITE_OTHER): Payer: Medicare Other

## 2017-12-15 DIAGNOSIS — E039 Hypothyroidism, unspecified: Secondary | ICD-10-CM

## 2017-12-15 LAB — TSH: TSH: 1.07 u[IU]/mL (ref 0.35–4.50)

## 2017-12-15 LAB — T3, FREE: T3 FREE: 3 pg/mL (ref 2.3–4.2)

## 2017-12-15 LAB — T4, FREE: Free T4: 0.69 ng/dL (ref 0.60–1.60)

## 2017-12-16 ENCOUNTER — Other Ambulatory Visit: Payer: Self-pay

## 2017-12-16 MED ORDER — LIOTHYRONINE SODIUM 5 MCG PO TABS: 5.0000 ug | ORAL_TABLET | Freq: Every day | ORAL | 9 refills | Status: DC

## 2017-12-16 MED ORDER — LEVOTHYROXINE SODIUM 100 MCG PO TABS: 100.0000 ug | ORAL_TABLET | Freq: Every day | ORAL | 9 refills | Status: DC

## 2018-03-16 ENCOUNTER — Telehealth: Payer: Self-pay

## 2018-03-16 NOTE — Telephone Encounter (Signed)
Unable to contact the patient at this number, as the office is not capable of making international calls. Attempted to call pt's cellphone number listed in the chart and the brother (also the POA) answered. Brother requested that she be emailed, but was informed that we cannot send patient emails through personal work email accounts. A MyChart Message was sent to the patient in attempts to reach her and answer any questions she may have.

## 2018-03-16 NOTE — Telephone Encounter (Signed)
Patient called to state she needs an explanation for why she needs her medication to be different in the [place] than it is in the [place]- please call the patient at [phone]

## 2018-10-11 ENCOUNTER — Ambulatory Visit: Payer: Medicare Other | Admitting: Endocrinology

## 2018-10-12 ENCOUNTER — Ambulatory Visit: Payer: Medicare Other | Admitting: Endocrinology

## 2018-12-28 ENCOUNTER — Other Ambulatory Visit: Payer: Self-pay | Admitting: Endocrinology

## 2019-07-05 ENCOUNTER — Other Ambulatory Visit: Payer: Self-pay | Admitting: Endocrinology

## 2019-10-01 ENCOUNTER — Telehealth: Payer: Self-pay

## 2019-10-01 ENCOUNTER — Other Ambulatory Visit: Payer: Self-pay | Admitting: Endocrinology

## 2019-10-01 ENCOUNTER — Other Ambulatory Visit (INDEPENDENT_AMBULATORY_CARE_PROVIDER_SITE_OTHER): Payer: Medicare Other

## 2019-10-01 DIAGNOSIS — E039 Hypothyroidism, unspecified: Secondary | ICD-10-CM

## 2019-10-01 LAB — TSH: TSH: 0.11 u[IU]/mL — ABNORMAL LOW (ref 0.35–4.50)

## 2019-10-01 LAB — T4, FREE: Free T4: 0.59 ng/dL — ABNORMAL LOW (ref 0.60–1.60)

## 2019-10-01 LAB — T3, FREE: T3, Free: 3.2 pg/mL (ref 2.3–4.2)

## 2019-10-01 NOTE — Telephone Encounter (Signed)
New message   [facility] calling asking for labs to be entered into the system .    The patient will be returning

## 2019-10-01 NOTE — Telephone Encounter (Signed)
done

## 2019-10-03 ENCOUNTER — Other Ambulatory Visit: Payer: Self-pay

## 2019-10-03 ENCOUNTER — Ambulatory Visit (INDEPENDENT_AMBULATORY_CARE_PROVIDER_SITE_OTHER): Payer: Medicare Other | Admitting: Endocrinology

## 2019-10-03 ENCOUNTER — Encounter: Payer: Self-pay | Admitting: Endocrinology

## 2019-10-03 VITALS — BP 126/84 | HR 84 | Wt 146.0 lb

## 2019-10-03 DIAGNOSIS — E063 Autoimmune thyroiditis: Secondary | ICD-10-CM

## 2019-10-03 NOTE — Patient Instructions (Signed)
Resume previous Rxs

## 2019-10-03 NOTE — Progress Notes (Signed)
Patient ID: Karen Proctor, female   DOB: May 09, 1950, 69 y.o.   MRN: [id number]           Referring Provider: Mackie Pai, PA-C  Reason for Appointment:  Hypothyroidism, f/u visit    History of Present Illness:   Hypothyroidism was first diagnosed in 2004  At the time of diagnosis patient was having symptoms of significant fatigue but does not remember any other symptoms typical of hypothyroidism She was also having generalized aching symptoms She was seen by a holistic physician who told her that she was hypothyroid Not clear if she had a baseline thyroid enlargement or abnormal antibody studies  She was treated with Armour Thyroid initially with improvement in her thyroid symptoms Subsequently she was also tried by another physician on levothyroxine but she felt more tired with this Had Since about 2011 or so she had been taking a combination of levothyroxine and Cytomel  The patient is partly followed in the [place] where she lives most of the time by a local endocrinologist Previous TSH levels done in the [place] since 2018 indicate that TSH level has been low normal or low ranging between 0.11 and 0.54  RECENT HISTORY:  She did not come back for follow-up after her initial visit in 12/2017  Apparently in 2/21 she was having symptoms of chest pain and cardiac evaluation was negative.  She was told that her symptoms were related to side effects of Euthyrox Review of her labs indicates that her TSH had been low in 07/01/2018 and 08/31/2018 but normal in 7/20 Not clear what dose that she was taking previously  Apparently after her hospitalization for chest pain she was switched from levothyroxine and Cytomel to Armour Thyroid equivalent in the [place].  This comes in a capsule form  With this she says she feels fairly good with her energy level She may have gained some weight gradually but only about 3 pounds from her record Now does not have any shakiness,  anxiety, palpitations or heat intolerance Most recent TSH level was 0.5 done on 06/2019  She wants to continue her supply from [place] as she has a 8-month supply with her, she is visiting here at least till November  She has been regular with taking her supplement before breakfast on empty stomach  Her labs now show a low TSH         Patient's weight history is as follows:  Wt Readings from Last 3 Encounters:  10/03/19 146 lb (66.2 kg)  12/12/17 147 lb 3.2 oz (66.8 kg)  11/15/17 143 lb (64.9 kg)    Thyroid function results have been as follows:  Lab Results  Component Value Date   TSH 0.11 (L) 10/01/2019   TSH 1.07 12/15/2017   TSH 0.04 (L) 10/11/2017   TSH 0.061 (L) 12/01/2016   FREET4 0.59 (L) 10/01/2019   FREET4 0.69 12/15/2017   FREET4 1.13 10/11/2017   FREET4 1.55 12/01/2016   T3FREE 3.2 10/01/2019   T3FREE 3.0 12/15/2017   Lab Results  Component Value Date   T3FREE 3.2 10/01/2019   T3FREE 3.0 12/15/2017      Past Medical History:  Diagnosis Date  . Adrenal insufficiency ([place])   . Fibromyalgia   . Hypothyroidism   . Inflammatory arthritis 11/15/2013  . Lead toxicity   . Metabolic syndrome   . Postmenopausal   . Vitamin D deficiency     Past Surgical History:  Procedure Laterality Date  . APPENDECTOMY  1981  . fibroid  tumor    . [place]  . OTHER SURGICAL HISTORY  1990   surgery for endometriosis and removal of fibroid tumor.  . TOE SURGERY    . TONSILLECTOMY  2009    Family History  Problem Relation Age of Onset  . COPD Mother 3  . Skin cancer Father   . Colon cancer Neg Hx   . Esophageal cancer Neg Hx   . Rectal cancer Neg Hx   . Stomach cancer Neg Hx   . Thyroid disease Neg Hx     Social History:  reports that she has never smoked. She has never used smokeless tobacco. She reports current alcohol use. She reports that she does not use drugs.  Allergies:  Allergies  Allergen Reactions  . Gluten Meal     Allergies as of  10/03/2019      Reactions   Gluten Meal       Medication List       Accurate as of October 03, 2019  1:56 PM. If you have any questions, ask your nurse or doctor.        aspirin 81 MG chewable tablet Chew by mouth daily.   levothyroxine 100 MCG tablet Commonly known as: SYNTHROID Take 1 tablet (100 mcg total) by mouth daily before breakfast.   liothyronine 5 MCG tablet Commonly known as: CYTOMEL TAKE 1 TABLET BY MOUTH EVERY DAY          Review of Systems              Examination:    BP 126/84   Pulse 84   Wt 146 lb (66.2 kg)   SpO2 96%   BMI 30.51 kg/m   GENERAL:  She looks well  THYROID:  Not palpable.  NEUROLOGICAL: Reflexes are brisk at biceps No tremor  .  No ankle edema present   Assessment:  HYPOTHYROIDISM, long-standing and presumably primary No associated goiter on exam again  Her previous history, details of her treatment over the last 2 years and all the labs available were reviewed in detail  She appears to have symptoms of nonspecific chest pain when she has iatrogenic hyperthyroidism but not clear what doses of Euthyrox she was taking 2/21 when she had symptoms On her last visit in 12/19 her labs were quite normal including TSH with 100 mcg of levothyroxine and 5 mcg of liothyronine  Discussed with her that with the Armour Thyroid she is taking her T3 levels are now normal but TSH is relatively low and free T4 is low indicating relatively excess of liothyronine and the preparation she is using She is showing some hyperreflexia but not clear if this is from anxiety  PLAN:   Discussed with the patient that reducing her Armour Thyroid by her 90 mg capsule once a week would probably be reducing the dose excessively and would prefer that she go back to the levothyroxine 100 mcg and liothyronine 5 mcg that she had done well with before She already has these prescriptions at home   Follow-up in 6 weeks, to call if she has any difficulties  before that   Elayne Snare 10/03/2019, 1:56 PM   Consultation note copy sent to the PCP  Note: This office note was prepared with Dragon voice recognition system technology. Any transcriptional errors that result from this process are unintentional.

## 2019-10-04 ENCOUNTER — Other Ambulatory Visit: Payer: Self-pay | Admitting: Endocrinology

## 2019-10-04 ENCOUNTER — Telehealth: Payer: Self-pay | Admitting: Endocrinology

## 2019-10-04 MED ORDER — LEVOTHYROXINE SODIUM 75 MCG PO TABS: 75.0000 ug | ORAL_TABLET | Freq: Every day | ORAL | 0 refills | Status: DC

## 2019-10-04 NOTE — Telephone Encounter (Signed)
Have discussed with patient degeneration of her Armour Thyroid to levothyroxine/Cytomel.  Please change her levothyroxine prescription to 75 mcg once daily, she will need [facility].  Not sure if this is on [employer]?

## 2019-10-05 ENCOUNTER — Other Ambulatory Visit: Payer: Self-pay

## 2019-10-12 DIAGNOSIS — Z23 Encounter for immunization: Secondary | ICD-10-CM | POA: Diagnosis not present

## 2019-10-31 ENCOUNTER — Other Ambulatory Visit: Payer: Self-pay | Admitting: Endocrinology

## 2019-11-15 ENCOUNTER — Other Ambulatory Visit: Payer: Medicare Other

## 2019-11-15 ENCOUNTER — Other Ambulatory Visit (INDEPENDENT_AMBULATORY_CARE_PROVIDER_SITE_OTHER): Payer: Medicare Other

## 2019-11-15 DIAGNOSIS — E063 Autoimmune thyroiditis: Secondary | ICD-10-CM | POA: Diagnosis not present

## 2019-11-15 LAB — T3, FREE: T3, Free: 3 pg/mL (ref 2.3–4.2)

## 2019-11-15 LAB — T4, FREE: Free T4: 0.86 ng/dL (ref 0.60–1.60)

## 2019-11-15 LAB — TSH: TSH: 0.33 u[IU]/mL — ABNORMAL LOW (ref 0.35–4.50)

## 2019-11-16 ENCOUNTER — Other Ambulatory Visit: Payer: Medicare Other

## 2019-11-20 ENCOUNTER — Other Ambulatory Visit: Payer: Self-pay

## 2019-11-20 ENCOUNTER — Ambulatory Visit: Payer: Medicare Other | Admitting: Endocrinology

## 2019-11-20 ENCOUNTER — Encounter: Payer: Self-pay | Admitting: Endocrinology

## 2019-11-20 VITALS — BP 122/74 | HR 86 | Ht <= 58 in | Wt 149.6 lb

## 2019-11-20 DIAGNOSIS — E039 Hypothyroidism, unspecified: Secondary | ICD-10-CM

## 2019-11-20 NOTE — Progress Notes (Addendum)
Patient ID: Karen Proctor, female   DOB: 1950/02/22, 69 y.o.   MRN: [id number]           Referring Provider: Mackie Pai, PA-C  Reason for Appointment:  Hypothyroidism, f/u visit    History of Present Illness:   Hypothyroidism was first diagnosed in 2004  At the time of diagnosis patient was having symptoms of significant fatigue but does not remember any other symptoms typical of hypothyroidism She was also having generalized aching symptoms She was seen by a holistic physician who told her that she was hypothyroid Not clear if she had a baseline thyroid enlargement or abnormal antibody studies  She was treated with Armour Thyroid initially with improvement in her thyroid symptoms Subsequently she was also tried by another physician on levothyroxine but she felt more tired with this Had Since about 2011 or so she had been taking a combination of levothyroxine and Cytomel  The patient is partly followed in the [place] where she lives most of the time by a local endocrinologist Previous TSH levels done in the [place] since 2018 indicate that TSH level has been low normal or low ranging between 0.11 and 0.54  RECENT HISTORY:  Apparently in 2/21 in [place] she was having symptoms of chest pain and cardiac evaluation was negative.  She was told that her symptoms were related to side effects of Euthyrox Review of her labs indicates that her TSH had been low in 07/01/2018 and 08/31/2018 but normal in 7/20  In 2021 she was switched to Armour Thyroid equivalent capsules in the [place]. Subsequently TSH was 0.5 done in 06/2019  On her follow-up visit in 9/21 she was not complaining of any unusual fatigue or palpitations However she thinks she was having some feeling of weakness, chest discomfort similar to when she had over replacement with thyroid hormones previously  Since her TSH was low at 0.11 without any increase in her T4 or T3 level she was switched to  levothyroxine and Cytomel She is taking 75 mcg of levothyroxine and 5 mcg of liothyronine  Currently does not feel any shakiness, anxiety, palpitations, chest pressure or heat intolerance However she thinks she is feeling more tired although this is better in the last couple of days  She has been regular with taking her supplements before breakfast on empty stomach  Her labs now show a low normal TSH at 0.33 compared to 0.11 along with normal T4 and T3 levels         Patient's weight history is as follows:  Wt Readings from Last 3 Encounters:  11/20/19 149 lb 9.6 oz (67.9 kg)  10/03/19 146 lb (66.2 kg)  12/12/17 147 lb 3.2 oz (66.8 kg)    Thyroid function results have been as follows:  Lab Results  Component Value Date   TSH 0.33 (L) 11/15/2019   TSH 0.11 (L) 10/01/2019   TSH 1.07 12/15/2017   TSH 0.04 (L) 10/11/2017   FREET4 0.86 11/15/2019   FREET4 0.59 (L) 10/01/2019   FREET4 0.69 12/15/2017   FREET4 1.13 10/11/2017   T3FREE 3.0 11/15/2019   T3FREE 3.2 10/01/2019   T3FREE 3.0 12/15/2017   Lab Results  Component Value Date   T3FREE 3.0 11/15/2019   T3FREE 3.2 10/01/2019   T3FREE 3.0 12/15/2017      Past Medical History:  Diagnosis Date  . Adrenal insufficiency ([place])   . Fibromyalgia   . Hypothyroidism   . Inflammatory arthritis 11/15/2013  . Lead toxicity   .  Metabolic syndrome   . Postmenopausal   . Vitamin D deficiency     Past Surgical History:  Procedure Laterality Date  . APPENDECTOMY  1981  . fibroid tumor    . MYOMECTOMY  1993  . OTHER SURGICAL HISTORY  1990   surgery for endometriosis and removal of fibroid tumor.  . TOE SURGERY    . TONSILLECTOMY  2009    Family History  Problem Relation Age of Onset  . COPD Mother 22  . Skin cancer Father   . Colon cancer Neg Hx   . Esophageal cancer Neg Hx   . Rectal cancer Neg Hx   . Stomach cancer Neg Hx   . Thyroid disease Neg Hx     Social History:  reports that she has never smoked. She has  never used smokeless tobacco. She reports current alcohol use. She reports that she does not use drugs.  Allergies:  Allergies  Allergen Reactions  . Gluten Meal     Allergies as of 11/20/2019      Reactions   Gluten Meal       Medication List       Accurate as of November 20, 2019  4:23 PM. If you have any questions, ask your nurse or doctor.        aspirin 81 MG chewable tablet Chew by mouth daily.   levothyroxine 75 MCG tablet Commonly known as: SYNTHROID TAKE 1 TABLET (75 MCG TOTAL) BY MOUTH DAILY BEFORE BREAKFAST.   liothyronine 5 MCG tablet Commonly known as: CYTOMEL TAKE 1 TABLET BY MOUTH EVERY DAY          Review of Systems  Endocrine: Positive for fatigue.                Examination:    BP 122/74   Pulse 86   Ht 4\' 10"  (1.473 m)   Wt 149 lb 9.6 oz (67.9 kg)   SpO2 97%   BMI 31.27 kg/m   She looks well  Reflexes are brisk at biceps No tremor of hands  Assessment:  HYPOTHYROIDISM, long-standing and presumably primary  Her previous regimen of Armour Thyroid was changed to levothyroxine and liothyronine Although she had previously done well with levothyroxine alone she believes that Euthyrox was causing thyrotoxicosis previously  Currently she is symptomatically doing relatively better and her fatigue is likely unrelated to her hypothyroidism on medications  Thyroid levels are nearly normal with TSH 0.33  PLAN:   She will now take 6-1/2 tablets a week of her levothyroxine 75 mcg Continue 5 mcg of liothyronine Since she is planning to go back to [place] in January she will see me back with labs before she leaves   Elayne Snare 11/20/2019, 4:23 PM     Note: This office note was prepared with Dragon voice recognition system technology. Any transcriptional errors that result from this process are unintentional.

## 2019-11-20 NOTE — Patient Instructions (Addendum)
Take 1/2 Levothyroxine once weekly and 1 pill other 6 days  Liothyronine 1 pill in am daily

## 2019-11-28 ENCOUNTER — Other Ambulatory Visit: Payer: Self-pay

## 2019-11-28 NOTE — Telephone Encounter (Signed)
New message     *STAT* If patient is at the pharmacy, call can be transferred to refill team.   1. Which medications need to be refilled? (please list name of each medication and dose if known)levothyroxine (SYNTHROID) 75 MCG tablet  2. Which pharmacy/location (including street and city if local pharmacy) is medication to be sent to?[facility] -fax # [phone]

## 2019-11-29 MED ORDER — LEVOTHYROXINE SODIUM 75 MCG PO TABS: ORAL_TABLET | ORAL | 5 refills | Status: DC

## 2019-11-29 NOTE — Telephone Encounter (Signed)
Refill sent to [facility] as requested

## 2019-12-04 ENCOUNTER — Other Ambulatory Visit: Payer: Self-pay | Admitting: *Deleted

## 2019-12-04 MED ORDER — LEVOTHYROXINE SODIUM 75 MCG PO TABS
ORAL_TABLET | ORAL | 1 refills | Status: DC
Start: 2019-12-04 — End: 2020-01-17

## 2019-12-04 MED ORDER — LIOTHYRONINE SODIUM 5 MCG PO TABS
5.0000 ug | ORAL_TABLET | Freq: Every day | ORAL | 1 refills | Status: DC
Start: 2019-12-04 — End: 2019-12-04

## 2019-12-04 MED ORDER — LIOTHYRONINE SODIUM 5 MCG PO TABS
5.0000 ug | ORAL_TABLET | Freq: Every day | ORAL | 1 refills | Status: DC
Start: 2019-12-04 — End: 2020-01-17

## 2019-12-17 ENCOUNTER — Telehealth: Payer: Self-pay | Admitting: *Deleted

## 2019-12-17 NOTE — Telephone Encounter (Signed)
Please see below and advise.

## 2019-12-17 NOTE — Telephone Encounter (Signed)
This is something her PCP has to sort out.  If she has a record of her previous vaccines she will need to show that

## 2019-12-17 NOTE — Telephone Encounter (Signed)
Patient called stating part of the year she lives in the [place] and she received both of her covid vaccines there. Patient states it has been 6 months since her injection and is asking for the booster. Patient wants to know [facility] so she can get her booster, [facility]    [phone]

## 2019-12-19 NOTE — Telephone Encounter (Signed)
Attempted to reach patient but no voicemail available.  Will sent mychart response.

## 2019-12-24 DIAGNOSIS — Z23 Encounter for immunization: Secondary | ICD-10-CM | POA: Diagnosis not present

## 2019-12-27 ENCOUNTER — Encounter: Payer: Self-pay | Admitting: *Deleted

## 2020-01-15 ENCOUNTER — Other Ambulatory Visit: Payer: Self-pay

## 2020-01-15 ENCOUNTER — Other Ambulatory Visit: Payer: Self-pay | Admitting: *Deleted

## 2020-01-15 ENCOUNTER — Other Ambulatory Visit (INDEPENDENT_AMBULATORY_CARE_PROVIDER_SITE_OTHER): Payer: Medicare Other

## 2020-01-15 ENCOUNTER — Telehealth: Payer: Self-pay | Admitting: Endocrinology

## 2020-01-15 DIAGNOSIS — E039 Hypothyroidism, unspecified: Secondary | ICD-10-CM

## 2020-01-15 LAB — T4, FREE: Free T4: 0.85 ng/dL (ref 0.60–1.60)

## 2020-01-15 LAB — T3, FREE: T3, Free: 2.7 pg/mL (ref 2.3–4.2)

## 2020-01-15 LAB — TSH: TSH: 1.3 u[IU]/mL (ref 0.35–4.50)

## 2020-01-15 NOTE — Telephone Encounter (Signed)
Patient advised that her pharmacy of choice is:  [facility] [address] [place] - [address] Phone:  [phone]  Fax:  [phone]

## 2020-01-15 NOTE — Telephone Encounter (Signed)
Noted,  Her chart has been updated

## 2020-01-17 ENCOUNTER — Encounter: Payer: Self-pay | Admitting: Endocrinology

## 2020-01-17 ENCOUNTER — Other Ambulatory Visit: Payer: Self-pay

## 2020-01-17 ENCOUNTER — Ambulatory Visit (INDEPENDENT_AMBULATORY_CARE_PROVIDER_SITE_OTHER): Payer: Medicare Other | Admitting: Endocrinology

## 2020-01-17 VITALS — BP 130/84 | HR 87 | Ht <= 58 in | Wt 154.8 lb

## 2020-01-17 DIAGNOSIS — E039 Hypothyroidism, unspecified: Secondary | ICD-10-CM

## 2020-01-17 MED ORDER — LIOTHYRONINE SODIUM 5 MCG PO TABS: 5.0000 ug | ORAL_TABLET | Freq: Every day | ORAL | 1 refills | Status: DC

## 2020-01-17 MED ORDER — LEVOTHYROXINE SODIUM 75 MCG PO TABS: ORAL_TABLET | ORAL | 1 refills | Status: DC

## 2020-01-17 NOTE — Progress Notes (Signed)
Patient ID: Karen Proctor, female   DOB: 1950-01-17, 70 y.o.   MRN: [id number]            Reason for Appointment: Hypothyroidism, follow-up visit    History of Present Illness:   Hypothyroidism was first diagnosed in 2004  At the time of diagnosis patient was having symptoms of significant fatigue but does not remember any other symptoms typical of hypothyroidism She was also having generalized aching symptoms She was seen by a holistic physician who told her that she was hypothyroid Not clear if she had a baseline thyroid enlargement or abnormal antibody studies  She was treated with Armour Thyroid initially with improvement in her thyroid symptoms Subsequently she was also tried by another physician on levothyroxine but she felt more tired with this Since about 2011 or so she had been taking a combination of levothyroxine and Cytomel  The patient is partly followed in the [place] where she lives most of the time by a local endocrinologist Previous TSH levels done in the [place] since 2018 showed that TSH level had been low normal or low ranging between 0.11 and 0.54 Apparently in 2/21 in [place] she was having symptoms of chest pain and cardiac evaluation was negative.  She was told that her symptoms were related to side effects of Euthyrox Review of her labs indicates that her TSH had been low in 07/01/2018 and 08/31/2018 but normal in 7/20  RECENT HISTORY:  In 2021 she was switched to Armour Thyroid equivalent capsules in the [place]. Subsequently TSH was 0.5 done in 06/2019  On her follow-up visit in 9/21  she was having some feeling of weakness, chest discomfort similar to when she had over replacement with thyroid hormones previously  Since her TSH was low at 0.11 without any increase in her T4 or T3 level she was switched to separate prescriptions of levothyroxine and Cytomel She is on 75 mcg of levothyroxine and 5 mcg of liothyronine Currently taking half  tablet less per week on her LEVOTHYROXINE since 11/21 when her TSH was low normal  She does not complain of any fatigue, unusual chest discomfort or palpitations She tends to have some anxiety because of family situation As before she is not missing any of her doses of both levothyroxine and liothyronine  She has been regular with taking her supplement before breakfast on empty stomach  Her labs now show a normal TSH of 1.3 compared to 0.33 along with normal T4 and T3 levels         Patient's weight history is as follows:  Wt Readings from Last 3 Encounters:  01/17/20 154 lb 12.8 oz (70.2 kg)  11/20/19 149 lb 9.6 oz (67.9 kg)  10/03/19 146 lb (66.2 kg)    Thyroid function results have been as follows:  Lab Results  Component Value Date   TSH 1.30 01/15/2020   TSH 0.33 (L) 11/15/2019   TSH 0.11 (L) 10/01/2019   TSH 1.07 12/15/2017   FREET4 0.85 01/15/2020   FREET4 0.86 11/15/2019   FREET4 0.59 (L) 10/01/2019   FREET4 0.69 12/15/2017   T3FREE 2.7 01/15/2020   T3FREE 3.0 11/15/2019   T3FREE 3.2 10/01/2019   Lab Results  Component Value Date   T3FREE 2.7 01/15/2020   T3FREE 3.0 11/15/2019   T3FREE 3.2 10/01/2019      Past Medical History:  Diagnosis Date  . Adrenal insufficiency (HCC)   . Fibromyalgia   . Hypothyroidism   . Inflammatory arthritis 11/15/2013  .  Lead toxicity   . Metabolic syndrome   . Postmenopausal   . Vitamin D deficiency     Past Surgical History:  Procedure Laterality Date  . APPENDECTOMY  1981  . fibroid tumor    . MYOMECTOMY  1993  . OTHER SURGICAL HISTORY  1990   surgery for endometriosis and removal of fibroid tumor.  . TOE SURGERY    . TONSILLECTOMY  2009    Family History  Problem Relation Age of Onset  . COPD Mother 72  . Skin cancer Father   . Colon cancer Neg Hx   . Esophageal cancer Neg Hx   . Rectal cancer Neg Hx   . Stomach cancer Neg Hx   . Thyroid disease Neg Hx     Social History:  reports that she has never  smoked. She has never used smokeless tobacco. She reports current alcohol use. She reports that she does not use drugs.  Allergies:  Allergies  Allergen Reactions  . Gluten Meal     Allergies as of 01/17/2020      Reactions   Gluten Meal       Medication List       Accurate as of January 17, 2020  3:14 PM. If you have any questions, ask your nurse or doctor.        aspirin 81 MG chewable tablet Chew by mouth daily.   levothyroxine 75 MCG tablet Commonly known as: SYNTHROID Take 1/2 tablet once weekly and 1 full tablet the other 6 days daily What changed: additional instructions   liothyronine 5 MCG tablet Commonly known as: CYTOMEL Take 1 tablet (5 mcg total) by mouth daily. 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  She has had variable regimen for thyroid supplementation over the last several years  Her previous regimen of Armour Thyroid was changed to levothyroxine and liothyronine for more flexibility of control  She is doing fairly well subjectively Tends to have symptoms of hyperthyroidism even with mild over replacement  Thyroid levels are back to normal with TSH 1.3 compared to 0.33, this is only with reducing her levothyroxine by half tablet weekly  PLAN:   She will continue to take 6-1/2 tablets a week of her levothyroxine 75 mcg Continue 5 mcg of liothyronine She will take both supplements together in the morning at least 30-minute before breakfast as usual  Follow-up to be decided based on her travel plans   Elayne Snare 01/17/2020, 3:14 PM     Note: This office note was prepared with Dragon voice recognition system  technology. Any transcriptional errors that result from this process are unintentional.

## 2020-02-07 ENCOUNTER — Encounter: Payer: Self-pay | Admitting: Endocrinology

## 2020-02-07 ENCOUNTER — Telehealth: Payer: Self-pay | Admitting: Endocrinology

## 2020-02-07 NOTE — Telephone Encounter (Signed)
Patient called to check status of letter from Dr Dwyane Dee that she is taking to her physician in [place] to advise of her health at her visit here and lab work that was to be included. Call back # [phone]  Address:   Karen Proctor [address] [place] [facility] [place] [address]

## 2020-02-07 NOTE — Telephone Encounter (Signed)
Please see below and advise.

## 2020-09-29 DIAGNOSIS — Z23 Encounter for immunization: Secondary | ICD-10-CM | POA: Diagnosis not present

## 2020-10-06 DIAGNOSIS — U071 COVID-19: Secondary | ICD-10-CM | POA: Diagnosis not present

## 2020-10-21 ENCOUNTER — Other Ambulatory Visit: Payer: Self-pay | Admitting: Endocrinology

## 2020-10-21 ENCOUNTER — Other Ambulatory Visit: Payer: Self-pay

## 2020-10-21 ENCOUNTER — Other Ambulatory Visit (INDEPENDENT_AMBULATORY_CARE_PROVIDER_SITE_OTHER): Payer: Medicare Other

## 2020-10-21 DIAGNOSIS — E039 Hypothyroidism, unspecified: Secondary | ICD-10-CM

## 2020-10-21 LAB — TSH: TSH: 2.1 u[IU]/mL (ref 0.35–5.50)

## 2020-10-21 LAB — T3, FREE: T3, Free: 2.8 pg/mL (ref 2.3–4.2)

## 2020-10-21 LAB — T4, FREE: Free T4: 0.67 ng/dL (ref 0.60–1.60)

## 2020-10-24 ENCOUNTER — Ambulatory Visit (INDEPENDENT_AMBULATORY_CARE_PROVIDER_SITE_OTHER): Payer: Medicare Other | Admitting: Endocrinology

## 2020-10-24 ENCOUNTER — Other Ambulatory Visit: Payer: Self-pay

## 2020-10-24 VITALS — BP 124/82 | HR 76 | Ht 59.0 in | Wt 157.2 lb

## 2020-10-24 DIAGNOSIS — E039 Hypothyroidism, unspecified: Secondary | ICD-10-CM | POA: Diagnosis not present

## 2020-10-24 NOTE — Progress Notes (Signed)
Patient ID: Karen Proctor, female   DOB: 1950/12/27, 70 y.o.   MRN: [id number]            Reason for Appointment: Hypothyroidism, follow-up visit    History of Present Illness:   Hypothyroidism was first diagnosed in 2004  At the time of diagnosis patient was having symptoms of significant fatigue but does not remember any other symptoms typical of hypothyroidism She was also having generalized aching symptoms She was seen by a holistic physician who told her that she was hypothyroid Not clear if she had a baseline thyroid enlargement or abnormal antibody studies  She was treated with Armour Thyroid initially with improvement in her thyroid symptoms Subsequently she was also tried by another physician on levothyroxine but she felt more tired with this Since about 2011 or so she had been taking a combination of levothyroxine and Cytomel  The patient is partly followed in the [place] where she lives most of the time by a local endocrinologist Previous TSH levels done in the [place] since 2018 showed that TSH level had been low normal or low ranging between 0.11 and 0.54 Apparently in 2/21 in [place] she was having symptoms of chest pain and cardiac evaluation was negative.  She was told that her symptoms were related to side effects of Euthyrox Review of her labs indicates that her TSH had been low in 07/01/2018 and 08/31/2018 but normal in 7/20  RECENT HISTORY:  In 2021 she was switched to Armour Thyroid equivalent capsules in the [place].  On her follow-up visit in 9/21  she was having some feeling of weakness, chest discomfort similar to when she had over replacement with thyroid hormones previously  Since her TSH was low at 0.11 without any increase in her T4/T3 levels she was switched to a combination of levothyroxine and Cytomel She is on 75 mcg of levothyroxine and 5 mcg of liothyronine  Currently skipping 1 tablet per week on her LEVOTHYROXINE since 11/21 when  her TSH was low normal; she was previously told to take only half a tablet on Sundays but she is skipping the dose on her own Has not had any labs since 1/22  More recently she is complaining of being tired all the time but she thinks this is from a lot of stress, previously having difficulty with sleep and dealing with a lot of family issues  She has taken her levothyroxine and liothyronine consistently before eating in the morning together No cold intolerance or hair loss Also no palpitations She thinks that sometimes she will have a little chest pressure which she sometimes has when her thyroid levels are high  Her labs again show a normal TSH  Also has normal T4 and T3 levels         Patient's weight history is as follows:  Wt Readings from Last 3 Encounters:  10/24/20 157 lb 3.2 oz (71.3 kg)  01/17/20 154 lb 12.8 oz (70.2 kg)  11/20/19 149 lb 9.6 oz (67.9 kg)    Thyroid function results have been as follows:  Lab Results  Component Value Date   TSH 2.10 10/21/2020   TSH 1.30 01/15/2020   TSH 0.33 (L) 11/15/2019   TSH 0.11 (L) 10/01/2019   FREET4 0.67 10/21/2020   FREET4 0.85 01/15/2020   FREET4 0.86 11/15/2019   FREET4 0.59 (L) 10/01/2019   T3FREE 2.8 10/21/2020   T3FREE 2.7 01/15/2020   T3FREE 3.0 11/15/2019   Lab Results  Component Value Date  T3FREE 2.8 10/21/2020   T3FREE 2.7 01/15/2020   T3FREE 3.0 11/15/2019      Past Medical History:  Diagnosis Date   Adrenal insufficiency (HCC)    Fibromyalgia    Hypothyroidism    Inflammatory arthritis 11/15/2013   Lead toxicity    Metabolic syndrome    Postmenopausal    Vitamin D deficiency     Past Surgical History:  Procedure Laterality Date   APPENDECTOMY  1981   fibroid tumor     [place]   surgery for endometriosis and removal of fibroid tumor.   TOE SURGERY     TONSILLECTOMY  2009    Family History  Problem Relation Age of Onset   COPD Mother 53   Skin  cancer Father    Colon cancer Neg Hx    Esophageal cancer Neg Hx    Rectal cancer Neg Hx    Stomach cancer Neg Hx    Thyroid disease Neg Hx     Social History:  reports that she has never smoked. She has never used smokeless tobacco. She reports current alcohol use. She reports that she does not use drugs.  Allergies:  Allergies  Allergen Reactions   Gluten Meal     Allergies as of 10/24/2020       Reactions   Gluten Meal         Medication List        Accurate as of October 24, 2020 10:56 AM. If you have any questions, ask your nurse or doctor.          levothyroxine 75 MCG tablet Commonly known as: SYNTHROID Take 1/2 tablet once weekly and 1 full tablet the other 6 days daily   liothyronine 5 MCG tablet Commonly known as: CYTOMEL Take 1 tablet (5 mcg total) by mouth daily.           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 She has had variable regimen for thyroid supplementation previously  She now has been on a stable regimen of levothyroxine and liothyronine instead of fixed combinations that she was started [place]  Recent complaints of fatigue and weight gain are related to stress, family issues and grief  With her TSH being excellent at 2.1 and T3 and T4 in the normal range she does not need any change in her regimen She does tend to have slightly variable and free T4 levels while taking Cytomel at the same time  PLAN:   She will continue to take 6 tablets a week of levothyroxine 75 mcg No change in 5 mcg of liothyronine  Follow-up in 6 months  Encourage her to start being more active with some exercise  regimen as tolerated She will also need to try to modify her diet with reduce overall caloric intake from lower fat and carbohydrate intake and increase fiber   Elayne Snare 10/24/2020, 10:56 AM     Note: This office note was prepared with Dragon voice recognition system technology. Any transcriptional  errors that result from this process are unintentional.

## 2021-01-23 DIAGNOSIS — Z1231 Encounter for screening mammogram for malignant neoplasm of breast: Secondary | ICD-10-CM | POA: Diagnosis not present

## 2021-01-23 LAB — HM MAMMOGRAPHY: HM Mammogram: NORMAL (ref 0–4)

## 2021-02-11 ENCOUNTER — Other Ambulatory Visit: Payer: Self-pay

## 2021-02-11 ENCOUNTER — Telehealth: Payer: Self-pay

## 2021-02-11 DIAGNOSIS — E039 Hypothyroidism, unspecified: Secondary | ICD-10-CM

## 2021-02-11 MED ORDER — LEVOTHYROXINE SODIUM 75 MCG PO TABS: ORAL_TABLET | ORAL | 1 refills | Status: DC

## 2021-02-11 MED ORDER — LIOTHYRONINE SODIUM 5 MCG PO TABS: 5.0000 ug | ORAL_TABLET | Freq: Every day | ORAL | 1 refills | Status: DC

## 2021-02-11 NOTE — Telephone Encounter (Signed)
Patient called to have her Pharmacy changed from [facility] to [facility]. Sent Rx to [facility] for patient

## 2021-03-23 DIAGNOSIS — Z124 Encounter for screening for malignant neoplasm of cervix: Secondary | ICD-10-CM | POA: Diagnosis not present

## 2021-03-23 DIAGNOSIS — Z01419 Encounter for gynecological examination (general) (routine) without abnormal findings: Secondary | ICD-10-CM | POA: Diagnosis not present

## 2021-03-23 DIAGNOSIS — Z01411 Encounter for gynecological examination (general) (routine) with abnormal findings: Secondary | ICD-10-CM | POA: Diagnosis not present

## 2021-03-30 ENCOUNTER — Ambulatory Visit (INDEPENDENT_AMBULATORY_CARE_PROVIDER_SITE_OTHER): Payer: Medicare Other | Admitting: Medical

## 2021-03-30 ENCOUNTER — Encounter: Payer: Self-pay | Admitting: Medical

## 2021-03-30 VITALS — BP 140/80 | HR 90 | Temp 97.9°F | Ht 59.0 in | Wt 159.2 lb

## 2021-03-30 DIAGNOSIS — E039 Hypothyroidism, unspecified: Secondary | ICD-10-CM | POA: Diagnosis not present

## 2021-03-30 DIAGNOSIS — E8881 Metabolic syndrome: Secondary | ICD-10-CM | POA: Diagnosis not present

## 2021-03-30 DIAGNOSIS — I10 Essential (primary) hypertension: Secondary | ICD-10-CM

## 2021-03-30 DIAGNOSIS — E7800 Pure hypercholesterolemia, unspecified: Secondary | ICD-10-CM

## 2021-03-30 DIAGNOSIS — E559 Vitamin D deficiency, unspecified: Secondary | ICD-10-CM | POA: Diagnosis not present

## 2021-03-30 NOTE — Patient Instructions (Addendum)
For your hypothyroid history will get tsh, t3 and t4 today. Please check with Dr. Dwyane Dee office to see if he is retiring and what is plan going forward in regards to who will replace him. If you don't have endocrinologist please let me know as might refer you to different practice.(Gave you name of 2 others in office that are very good) ? ?Borderline bp level today. Placed future cmp and lipid panel as you want to wait until later to get after eating healthier. ? ?Vit D def hx. Vit D level placed future as well. ? ? ?Follow up date to be determined after lab review. ? ? ?

## 2021-03-30 NOTE — Progress Notes (Signed)
? ?Subjective:  ? ? Patient ID: Karen Proctor, female    DOB: Sep 12, 1950, 71 y.o.   MRN: [id number] ? ?HPI ? ?Pt in for follow up to get established. Last saw her 10-11-2017. ? ? ?Pt moved back from [place]. Pt updates me she got divorced in 2022. Move back to [place] May, 24, 2022. ? ?Her dad passed July 4 past year.  ? ?She has low thyroid. Pt thyroid is stable. Pt was on cytomel and on synthroid.  ? ?Pt uses [employer] [facility] [employer]. ? ?Pt in past she got hyperthyroid. She states at times in past she would get fast heart rates at times if over supplamented.  ? ?One time when she was in [place] she got toxic.  ? ?Pt is currently taking 1 tab po daily and skipping levothyroxine 75 mcg daily.  ?Pt is cytomel 5 mg daily.  ? ?On Sunday she does not take either. ? ? ? ?Review of Systems  ?Constitutional:  Negative for chills, fatigue and fever.  ?Respiratory:  Negative for cough, chest tightness and shortness of breath.   ?Cardiovascular:  Negative for palpitations.  ?Gastrointestinal:  Negative for abdominal pain, diarrhea and nausea.  ?Genitourinary:  Negative for dysuria.  ?Musculoskeletal:  Negative for back pain, joint swelling, myalgias and neck stiffness.  ?Skin:  Negative for rash.  ?Neurological:  Negative for facial asymmetry, speech difficulty, weakness and numbness.  ?Hematological:  Negative for adenopathy. Does not bruise/bleed easily.  ?Psychiatric/Behavioral:  Negative for behavioral problems, decreased concentration and hallucinations. The patient is not nervous/anxious and is not hyperactive.   ? ? ?Past Medical History:  ?Diagnosis Date  ? Adrenal insufficiency ([place])   ? Fibromyalgia   ? Hypothyroidism   ? Inflammatory arthritis 11/15/2013  ? Lead toxicity   ? Metabolic syndrome   ? Postmenopausal   ? Vitamin D deficiency   ? ?  ?Social History  ? ?Socioeconomic History  ? Marital status: Divorced  ?  Spouse name: Not on file  ? Number of children: Not on file  ? Years of education: Not on file  ?  Highest education level: Not on file  ?Occupational History  ? Occupation: Retired  ?Tobacco Use  ? Smoking status: Never  ? Smokeless tobacco: Never  ?Vaping Use  ? Vaping Use: Never used  ?Substance and Sexual Activity  ? Alcohol use: Yes  ?  Alcohol/week: 0.0 standard drinks  ?  Comment: very occasionally  ? Drug use: No  ? Sexual activity: Not on file  ?Other Topics Concern  ? Not on file  ?Social History Narrative  ? Not on file  ? ?Social Determinants of Health  ? ?Financial Resource Strain: Not on file  ?Food Insecurity: Not on file  ?Transportation Needs: Not on file  ?Physical Activity: Not on file  ?Stress: Not on file  ?Social Connections: Not on file  ?Intimate Partner Violence: Not on file  ? ? ?Past Surgical History:  ?Procedure Laterality Date  ? APPENDECTOMY  1981  ? fibroid tumor    ? MYOMECTOMY  1993  ? OTHER SURGICAL HISTORY  1990  ? surgery for endometriosis and removal of fibroid tumor.  ? TOE SURGERY    ? TONSILLECTOMY  2009  ? ? ?Family History  ?Problem Relation Age of Onset  ? COPD Mother 39  ? Skin cancer Father   ? Colon cancer Neg Hx   ? Esophageal cancer Neg Hx   ? Rectal cancer Neg Hx   ? Stomach cancer  Neg Hx   ? Thyroid disease Neg Hx   ? ? ?Allergies  ?Allergen Reactions  ? Gluten Meal   ? ? ?Current Outpatient Medications on File Prior to Visit  ?Medication Sig Dispense Refill  ? levothyroxine (SYNTHROID) 75 MCG tablet Take 1/2 tablet once weekly and 1 full tablet the other 6 days daily 90 tablet 1  ? liothyronine (CYTOMEL) 5 MCG tablet Take 1 tablet (5 mcg total) by mouth daily. 90 tablet 1  ? ?No current facility-administered medications on file prior to visit.  ? ? ?BP 140/80 Comment: borderline level  Pulse 90   Temp 97.9 ?F (36.6 ?C)   Ht '4\' 11"'$  (1.499 m)   Wt 159 lb 3.2 oz (72.2 kg)   SpO2 100%   BMI 32.15 kg/m?  ?  ?   ?Objective:  ? Physical Exam ? ?General ?Mental Status- Alert. General Appearance- Not in acute distress.  ? ?Skin ?General: Color- Normal Color.  Moisture- Normal Moisture. ? ?Neck ?Carotid Arteries- Normal color. Moisture- Normal Moisture. No carotid bruits. No JVD. ? ?Chest and Lung Exam ?Auscultation: ?Breath Sounds:-Normal. ? ?Cardiovascular ?Auscultation:Rythm- Regular. ?Murmurs & Other Heart Sounds:Auscultation of the heart reveals- No Murmurs. ? ?Abdomen ?Inspection:-Inspeection Normal. ?Palpation/Percussion:Note:No mass. Palpation and Percussion of the abdomen reveal- Non Tender, Non Distended + BS, no rebound or guarding. ? ? ?Neurologic ?Cranial Nerve exam:- CN III-XII intact(No nystagmus), symmetric smile. ?Strength:- 5/5 equal and symmetric strength both upper and lower extremities.  ? ? ?   ?Assessment & Plan:  ? ?Patient Instructions  ?For your hypothyroid history will get tsh, t3 and t4 today. Please check with Dr. Dwyane Dee office to see if he is retiring and what is plan going forward in regards to who will replace him. If you don't have endocrinologist please let me know as might refer you to different practice.(Gave you name of 2 others in office that are very good) ? ?Borderline bp level today. Placed future cmp and lipid panel as you want to wait until later to get after eating healthier. ? ?Vit D def hx. Vit D level placed future as well. ? ? ?Follow up date to be determined after lab review. ? ?  ?Mackie Pai, PA-C  ?

## 2021-03-31 LAB — TSH: TSH: 1.89 u[IU]/mL (ref 0.35–5.50)

## 2021-03-31 LAB — T4, FREE: Free T4: 0.84 ng/dL (ref 0.60–1.60)

## 2021-03-31 LAB — T3, FREE: T3, Free: 3.4 pg/mL (ref 2.3–4.2)

## 2021-04-10 ENCOUNTER — Ambulatory Visit (INDEPENDENT_AMBULATORY_CARE_PROVIDER_SITE_OTHER): Payer: Medicare Other | Admitting: Internal Medicine

## 2021-04-10 ENCOUNTER — Ambulatory Visit (HOSPITAL_BASED_OUTPATIENT_CLINIC_OR_DEPARTMENT_OTHER)
Admission: RE | Admit: 2021-04-10 | Discharge: 2021-04-10 | Disposition: A | Discharge status: Home / Self Care | Payer: Medicare Other | Source: Ambulatory Visit | Attending: Internal Medicine | Admitting: Internal Medicine

## 2021-04-10 ENCOUNTER — Telehealth: Payer: Self-pay

## 2021-04-10 DIAGNOSIS — R0789 Other chest pain: Secondary | ICD-10-CM | POA: Insufficient documentation

## 2021-04-10 DIAGNOSIS — Y929 Unspecified place or not applicable: Secondary | ICD-10-CM | POA: Diagnosis not present

## 2021-04-10 DIAGNOSIS — R0781 Pleurodynia: Secondary | ICD-10-CM | POA: Insufficient documentation
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
## 2021-04-10 NOTE — Patient Instructions (Signed)
Proceed with a x-ray downstairs ? ?Tylenol  500 mg OTC 2 tabs a day every 8 hours as needed for pain ? ?Call if not gradually better in the next 2 weeks ?
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
## 2021-04-13 ENCOUNTER — Telehealth: Payer: Self-pay
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
## 2021-04-14 ENCOUNTER — Telehealth: Payer: Self-pay | Admitting: Medical

## 2021-04-14 NOTE — Telephone Encounter (Signed)
That is ok, thank you

## 2021-04-14 NOTE — Telephone Encounter (Signed)
Work note sent to [employer]  ?

## 2021-04-14 NOTE — Telephone Encounter (Signed)
Please advise 
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
## 2021-04-21 ENCOUNTER — Ambulatory Visit (HOSPITAL_BASED_OUTPATIENT_CLINIC_OR_DEPARTMENT_OTHER)
Admission: RE | Admit: 2021-04-21 | Discharge: 2021-04-21 | Disposition: A | Discharge status: Home / Self Care | Payer: Medicare Other | Source: Ambulatory Visit | Attending: Medical | Admitting: Medical

## 2021-04-21 ENCOUNTER — Ambulatory Visit (INDEPENDENT_AMBULATORY_CARE_PROVIDER_SITE_OTHER): Payer: Medicare Other | Admitting: Medical

## 2021-04-21 ENCOUNTER — Encounter: Payer: Self-pay | Admitting: Medical

## 2021-04-21 VITALS — BP 122/72 | HR 73 | Resp 18 | Ht 59.0 in | Wt 159.6 lb

## 2021-04-21 DIAGNOSIS — M25572 Pain in left ankle and joints of left foot: Secondary | ICD-10-CM

## 2021-04-21 DIAGNOSIS — M79662 Pain in left lower leg: Secondary | ICD-10-CM

## 2021-04-21 DIAGNOSIS — S8992XA Unspecified injury of left lower leg, initial encounter: Secondary | ICD-10-CM | POA: Diagnosis not present

## 2021-04-21 DIAGNOSIS — M898X6 Other specified disorders of bone, lower leg: Secondary | ICD-10-CM | POA: Insufficient documentation

## 2021-04-21 DIAGNOSIS — F439 Reaction to severe stress, unspecified: Secondary | ICD-10-CM

## 2021-04-21 DIAGNOSIS — L729 Follicular cyst of the skin and subcutaneous tissue, unspecified: Secondary | ICD-10-CM | POA: Diagnosis not present

## 2021-04-21 NOTE — Patient Instructions (Addendum)
For left lower ext calf, tibia and ankle pain will get imaging studies to evaluate for dvt or fractures. Can continue advil for pain. If [place] negative for dvt and xrays show no fracture then recommend Ace wrap of foot ankle and distal 1/3 of calf. Work note/excuse  given today. ? ?Continue advil for pain. ? ?Can refer to somatic counseling/therapist but would ask you call and get scheduled then notify me who you chose then I can place the referral when you notify me of the name of office, phone number and fax. ? ?Follow up in 10-14 days. ? ? ?
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
## 2021-04-22 NOTE — Addendum Note (Signed)
Addended by: Jeronimo Greaves on: 04/22/2021 04:02 PM ? ? Modules accepted: Orders ? ?

## 2021-04-22 NOTE — Addendum Note (Signed)
Addended by: Anabel Halon on: 04/22/2021 12:24 PM ? ? Modules accepted: Orders ? ?

## 2021-04-26 ENCOUNTER — Telehealth: Payer: Self-pay | Admitting: Medical
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
## 2021-04-27 ENCOUNTER — Telehealth: Payer: Self-pay | Admitting: Medical

## 2021-04-27 NOTE — Telephone Encounter (Signed)
Pt also states she needs ov notes from Dr.Paz and Percell Miller, copies of imaging and referral from [facility] for PT for insurance purposes.   ?

## 2021-04-27 NOTE — Telephone Encounter (Signed)
Labs , and 2 office notes sent to patients house ?
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
## 2021-06-01 ENCOUNTER — Encounter: Payer: Self-pay | Admitting: Medical

## 2021-06-02 NOTE — Addendum Note (Signed)
Addended by: Anabel Halon on: 06/02/2021 05:50 PM   Modules accepted: Orders

## 2021-06-03 ENCOUNTER — Telehealth: Payer: Self-pay | Admitting: Medical

## 2021-06-03 NOTE — Telephone Encounter (Signed)
Below is specific information for pt new referral just placed.  [facility], [address], [place], [place] [address] [email or web address]

## 2021-06-03 NOTE — Telephone Encounter (Signed)
We dont mail copies of referrals normally

## 2021-06-03 NOTE — Telephone Encounter (Signed)
Unable to send referral notes // process to patient

## 2021-06-03 NOTE — Telephone Encounter (Signed)
Patient would like a copy of her referral to [facility] to be mailed to her. Please advise

## 2021-07-06 ENCOUNTER — Other Ambulatory Visit: Payer: Medicare Other

## 2021-08-31 ENCOUNTER — Other Ambulatory Visit: Payer: Self-pay | Admitting: Endocrinology

## 2021-08-31 ENCOUNTER — Other Ambulatory Visit: Payer: Medicare Other

## 2021-08-31 DIAGNOSIS — E039 Hypothyroidism, unspecified: Secondary | ICD-10-CM

## 2021-09-03 ENCOUNTER — Other Ambulatory Visit: Payer: Self-pay | Admitting: Endocrinology

## 2021-09-03 DIAGNOSIS — E039 Hypothyroidism, unspecified: Secondary | ICD-10-CM

## 2021-09-04 ENCOUNTER — Other Ambulatory Visit: Payer: Self-pay

## 2021-09-04 DIAGNOSIS — E039 Hypothyroidism, unspecified: Secondary | ICD-10-CM

## 2021-09-04 MED ORDER — LEVOTHYROXINE SODIUM 75 MCG PO TABS: ORAL_TABLET | ORAL | 3 refills | Status: DC

## 2021-09-07 ENCOUNTER — Ambulatory Visit (INDEPENDENT_AMBULATORY_CARE_PROVIDER_SITE_OTHER): Payer: Medicare Other

## 2021-09-07 VITALS — Ht 59.0 in | Wt 159.0 lb

## 2021-09-07 DIAGNOSIS — Z0000 Encounter for general adult medical examination without abnormal findings: Secondary | ICD-10-CM

## 2021-09-07 NOTE — Patient Instructions (Signed)
Karen Proctor , Thank you for taking time to complete your Medicare Wellness Visit. I appreciate your ongoing commitment to your health goals. Please review the following plan we discussed and let me know if I can assist you in the future.   Screening recommendations/referrals: Colonoscopy: Completed 11/15/2017-Due 11/16/2022 Mammogram: Completed 01/23/2021-Due 01/23/2022 Bone Density: Due-Declined today. Per our conversation, you will have this done with your next mammogram. Recommended yearly ophthalmology/optometry visit for glaucoma screening and checkup Recommended yearly dental visit for hygiene and checkup  Vaccinations: Influenza vaccine: Up to date-Due 09/2021 Pneumococcal vaccine: Due-May obtain vaccine at our office or your local pharmacy. Tdap vaccine: Up to date Shingles vaccine: Due-May obtain vaccine at your local pharmacy. Covid-19:Up to date  Advanced directives: Copy in chart  Conditions/risks identified: See problem list  Next appointment: Follow up in one year for your annual wellness visit    Preventive Care 65 Years and Older, Female Preventive care refers to lifestyle choices and visits with your health care provider that can promote health and wellness. What does preventive care include? A yearly physical exam. This is also called an annual well check. Dental exams once or twice a year. Routine eye exams. Ask your health care provider how often you should have your eyes checked. Personal lifestyle choices, including: Daily care of your teeth and gums. Regular physical activity. Eating a healthy diet. Avoiding tobacco and drug use. Limiting alcohol use. Practicing safe sex. Taking low-dose aspirin every day. Taking vitamin and mineral supplements as recommended by your health care provider. What happens during an annual well check? The services and screenings done by your health care provider during your annual well check will depend on your age, overall  health, lifestyle risk factors, and family history of disease. Counseling  Your health care provider may ask you questions about your: Alcohol use. Tobacco use. Drug use. Emotional well-being. Home and relationship well-being. Sexual activity. Eating habits. History of falls. Memory and ability to understand (cognition). Work and work Statistician. Reproductive health. Screening  You may have the following tests or measurements: Height, weight, and BMI. Blood pressure. Lipid and cholesterol levels. These may be checked every 5 years, or more frequently if you are over 28 years old. Skin check. Lung cancer screening. You may have this screening every year starting at age 52 if you have a 30-pack-year history of smoking and currently smoke or have quit within the past 15 years. Fecal occult blood test (FOBT) of the stool. You may have this test every year starting at age 50. Flexible sigmoidoscopy or colonoscopy. You may have a sigmoidoscopy every 5 years or a colonoscopy every 10 years starting at age 72. Hepatitis C blood test. Hepatitis B blood test. Sexually transmitted disease (STD) testing. Diabetes screening. This is done by checking your blood sugar (glucose) after you have not eaten for a while (fasting). You may have this done every 1-3 years. Bone density scan. This is done to screen for osteoporosis. You may have this done starting at age 26. Mammogram. This may be done every 1-2 years. Talk to your health care provider about how often you should have regular mammograms. Talk with your health care provider about your test results, treatment options, and if necessary, the need for more tests. Vaccines  Your health care provider may recommend certain vaccines, such as: Influenza vaccine. This is recommended every year. Tetanus, diphtheria, and acellular pertussis (Tdap, Td) vaccine. You may need a Td booster every 10 years. Zoster vaccine. You may  need this after age  41. Pneumococcal 13-valent conjugate (PCV13) vaccine. One dose is recommended after age 56. Pneumococcal polysaccharide (PPSV23) vaccine. One dose is recommended after age 6. Talk to your health care provider about which screenings and vaccines you need and how often you need them. This information is not intended to replace advice given to you by your health care provider. Make sure you discuss any questions you have with your health care provider. Document Released: 01/24/2015 Document Revised: 09/17/2015 Document Reviewed: 10/29/2014 Elsevier Interactive Patient Education  2017 [place] Prevention in the Home Falls can cause injuries. They can happen to people of all ages. There are many things you can do to make your home safe and to help prevent falls. What can I do on the outside of my home? Regularly fix the edges of walkways and driveways and fix any cracks. Remove anything that might make you trip as you walk through a door, such as a raised step or threshold. Trim any bushes or trees on the path to your home. Use bright outdoor lighting. Clear any walking paths of anything that might make someone trip, such as rocks or tools. Regularly check to see if handrails are loose or broken. Make sure that both sides of any steps have handrails. Any raised decks and porches should have guardrails on the edges. Have any leaves, snow, or ice cleared regularly. Use sand or salt on walking paths during winter. Clean up any spills in your garage right away. This includes oil or grease spills. What can I do in the bathroom? Use night lights. Install grab bars by the toilet and in the tub and shower. Do not use towel bars as grab bars. Use non-skid mats or decals in the tub or shower. If you need to sit down in the shower, use a plastic, non-slip stool. Keep the floor dry. Clean up any water that spills on the floor as soon as it happens. Remove soap buildup in the tub or shower  regularly. Attach bath mats securely with double-sided non-slip rug tape. Do not have throw rugs and other things on the floor that can make you trip. What can I do in the bedroom? Use night lights. Make sure that you have a light by your bed that is easy to reach. Do not use any sheets or blankets that are too big for your bed. They should not hang down onto the floor. Have a firm chair that has side arms. You can use this for support while you get dressed. Do not have throw rugs and other things on the floor that can make you trip. What can I do in the kitchen? Clean up any spills right away. Avoid walking on wet floors. Keep items that you use a lot in easy-to-reach places. If you need to reach something above you, use a strong step stool that has a grab bar. Keep electrical cords out of the way. Do not use floor polish or wax that makes floors slippery. If you must use wax, use non-skid floor wax. Do not have throw rugs and other things on the floor that can make you trip. What can I do with my stairs? Do not leave any items on the stairs. Make sure that there are handrails on both sides of the stairs and use them. Fix handrails that are broken or loose. Make sure that handrails are as long as the stairways. Check any carpeting to make sure that it is firmly attached  to the stairs. Fix any carpet that is loose or worn. Avoid having throw rugs at the top or bottom of the stairs. If you do have throw rugs, attach them to the floor with carpet tape. Make sure that you have a light switch at the top of the stairs and the bottom of the stairs. If you do not have them, ask someone to add them for you. What else can I do to help prevent falls? Wear shoes that: Do not have high heels. Have rubber bottoms. Are comfortable and fit you well. Are closed at the toe. Do not wear sandals. If you use a stepladder: Make sure that it is fully opened. Do not climb a closed stepladder. Make sure that  both sides of the stepladder are locked into place. Ask someone to hold it for you, if possible. Clearly mark and make sure that you can see: Any grab bars or handrails. First and last steps. Where the edge of each step is. Use tools that help you move around (mobility aids) if they are needed. These include: Canes. Walkers. Scooters. Crutches. Turn on the lights when you go into a dark area. Replace any light bulbs as soon as they burn out. Set up your furniture so you have a clear path. Avoid moving your furniture around. If any of your floors are uneven, fix them. If there are any pets around you, be aware of where they are. Review your medicines with your doctor. Some medicines can make you feel dizzy. This can increase your chance of falling. Ask your doctor what other things that you can do to help prevent falls. This information is not intended to replace advice given to you by your health care provider. Make sure you discuss any questions you have with your health care provider. Document Released: 10/24/2008 Document Revised: 06/05/2015 Document Reviewed: 02/01/2014 Elsevier Interactive Patient Education  2017 [employer].

## 2021-09-07 NOTE — Progress Notes (Addendum)
Subjective:   Karen Proctor is a 71 y.o. female who presents for an Initial Medicare Annual Wellness Visit.  I connected with Amariah today by telephone and verified that I am speaking with the correct person using two identifiers. Location patient: home Location provider: work Persons participating in the virtual visit: patient, Marine scientist.    I discussed the limitations, risks, security and privacy concerns of performing an evaluation and management service by telephone and the availability of in person appointments. I also discussed with the patient that there may be a patient responsible charge related to this service. The patient expressed understanding and verbally consented to this telephonic visit.    Interactive audio and video telecommunications were attempted between this provider and patient, however failed, due to patient having technical difficulties OR patient did not have access to video capability.  We continued and completed visit with audio only.  Some vital signs may be absent or patient reported.   Time Spent with patient on telephone encounter: 30 minutes   Review of Systems     Cardiac Risk Factors include: advanced age (>76mn, >>44women);obesity (BMI >30kg/m2);dyslipidemia     Objective:    Today's Vitals   09/07/21 1521  Weight: 159 lb (72.1 kg)  Height: '4\' 11"'$  (1.499 m)   Body mass index is 32.11 kg/m.     09/07/2021    3:24 PM  Advanced Directives  Does Patient Have a Medical Advance Directive? Yes  Type of [place]amed[place]of [place] will  [place] of [place]? Yes - validated most recent copy scanned in chart (See row information)    Current Medications (verified) Outpatient Encounter Medications as of 09/07/2021  Medication Sig   levothyroxine (SYNTHROID) 75 MCG tablet Take 1/2 tablet once weekly and 1 full tablet the other 6 days daily   liothyronine (CYTOMEL) 5 MCG tablet TAKE ONE TABLET  BY MOUTH ONCE DAILY   No facility-administered encounter medications on file as of 09/07/2021.    Allergies (verified) Gluten meal   History: Past Medical History:  Diagnosis Date   Adrenal insufficiency (HCC)    Fibromyalgia    Hypothyroidism    Inflammatory arthritis 11/15/2013   Lead toxicity    Metabolic syndrome    Postmenopausal    Vitamin D deficiency    Past Surgical History:  Procedure Laterality Date   APPENDECTOMY  1981   [place] and [place] of fib[place] tumor.   TOE SURGERY     TONSILLECTOMY  2009   Family History  Problem Relation Age of Onset   COPD Mother 754  Skin cancer Father    Colon cancer Neg Hx    Esophageal cancer Neg Hx    Rectal cancer Neg Hx    Stomach cancer Neg Hx    Thyroid disease Neg Hx    Social History   Socioeconomic History   Marital status: Divorced    Spouse name: Not on file   Number of children: Not on file   Years of education: Not on file   Highest education level: Not on file  Occupational History   Occupation: Retired  Tobacco Use   Smoking status: Never   Smokeless tobacco: Never  Vaping Use   Vaping Use: Never used  Substance and Sexual Activity   Alcohol use: Yes    Alcohol/week: 0.0 standard drinks of  alcohol    Comment: very occasionally   Drug use: No   Sexual activity: Not on file  Other Topics Concern   Not on file  Social History Narrative   Not on file   Social Determinants of Health   Financial Resource Strain: Low Risk  (09/07/2021)   Overall Financial Resource Strain (CARDIA)    Difficulty of Paying Living Expenses: Not hard at all  Food Insecurity: No Food Insecurity (09/07/2021)   Hunger Vital Sign    Worried About Running Out of Food in the Last Year: Never true    Ran Out of Food in the Last Year: Never true  Transportation Needs: No Transportation Needs (09/07/2021)   PRAPARE - Radiographer, therapeutic (Medical): No    Lack of Transportation (Non-Medical): No  Physical Activity: Sufficiently Active (09/07/2021)   Exercise Vital Sign    Days of Exercise per Week: 5 days    Minutes of Exercise per Session: 30 min  Stress: Stress Concern Present (09/07/2021)   [place]    Feeling of Stress : To some extent  Social Connections: Moderately Integrated (09/07/2021)   Social Connection and Isolation Panel [NHANES]    Frequency of Communication with Friends and Family: More than three times a week    Frequency of Social Gatherings with Friends and Family: More than three times a week    Attends Religious Services: 1 to 4 times per year    Active Member of [employer] or Organizations: Yes    Attends Music therapist: More than 4 times per year    Marital Status: Divorced    Tobacco Counseling Counseling given: Not Answered   Clinical Intake:  Pre-visit preparation completed: Yes  Pain : No/denies pain     BMI - recorded: 32.11 Nutritional Status: BMI > 30  Obese Nutritional Risks: None Diabetes: No  How often do you need to have someone help you when you read instructions, pamphlets, or other written materials from your doctor or pharmacy?: 1 - Never  Diabetic?No  Interpreter Needed?: No  Information entered by :: Caroleen Hamman LPN   Activities of Daily Living    09/07/2021    3:29 PM  In your present state of health, do you have any difficulty performing the following activities:  Hearing? 0  Vision? 0  Difficulty concentrating or making decisions? 0  Walking or climbing stairs? 0  Dressing or bathing? 0  Doing errands, shopping? 0  Preparing Food and eating ? N  Using the Toilet? N  In the past six months, have you accidently leaked urine? Y  Do you have problems with loss of bowel control? N  Managing your Medications? N  Managing your Finances? N  Housekeeping or managing  your Housekeeping? N    Patient Care Team: [name], Iris Pert as PCP - General (Internal Medicine)  Indicate any recent Medical Services you may have received from other than [facility] providers in the past year (date may be approximate).     Assessment:   This is a routine wellness examination for Karen Proctor.  Hearing/Vision screen Hearing Screening - Comments:: No issues Vision Screening - Comments:: Last eye exam-08/2021-Dr. Jabier Mutton  Dietary issues and exercise activities discussed: Current Exercise Habits: Home exercise routine, Type of exercise: walking;stretching, Time (Minutes): 30, Frequency (Times/Week): 5, Weekly Exercise (Minutes/Week): 150, Intensity: Mild, Exercise limited by: None identified   Goals Addressed  This Visit's Progress    Patient Stated       Continue eating healthy & exercise       Depression Screen    09/07/2021    3:29 PM 03/30/2021    2:20 PM 12/01/2016    3:16 PM 11/08/2013    1:14 PM  PHQ 2/9 Scores  PHQ - 2 Score 0 0 0 0    Fall Risk    09/07/2021    3:25 PM 03/30/2021    2:19 PM 12/01/2016    3:16 PM 11/08/2013    1:14 PM  [place] in the past year? 0 0 No No  Number falls in past yr: 0 0    Injury with Fall? 0 0    Follow up Falls prevention discussed Falls evaluation completed      FALL RISK PREVENTION PERTAINING TO THE HOME:  Any stairs in or around the home? Yes  If so, are there any without handrails? No  Home free of loose throw rugs in walkways, pet beds, electrical cords, etc? Yes  Adequate lighting in your home to reduce risk of falls? Yes   ASSISTIVE DEVICES UTILIZED TO PREVENT FALLS:  Life alert? No  Use of a cane, walker or w/c? No  Grab bars in the bathroom? Yes  Shower chair or bench in shower? No  Elevated toilet seat or a handicapped toilet? No   TIMED UP AND GO:  Was the test performed? No . Phone viist   Cognitive Function:Normal cognitive status assessed by this Nurse Health Advisor.  No abnormalities found.         09/07/2021    3:41 PM  6CIT Screen  What Year? 0 points  What month? 0 points  What time? 0 points  Count back from 20 0 points  Months in reverse 0 points  Repeat phrase 0 points  Total Score 0 points    Immunizations Immunization History  Administered Date(s) Administered   HPV Quadrivalent 10/26/2019   Influenza Split 10/02/2013, 10/02/2020   Influenza Whole 10/22/2008   Influenza, High Dose Seasonal PF 09/29/2017   Influenza-Unspecified 10/02/2013   PFIZER(Purple Top)SARS-COV-2 Vaccination 05/17/2019, 06/21/2019, 12/24/2019, 04/10/2020   Pfizer Covid-19 Vaccine Bivalent Booster 22yr & up 09/29/2020   Pneumococcal Conjugate-13 01/14/2014, 03/03/2017   Tdap 02/27/2012   Zoster, Live 01/14/2014    TDAP status: Up to date  Flu Vaccine status: Up to date  Pneumococcal vaccine status: Due, Education has been provided regarding the importance of this vaccine. Advised may receive this vaccine at local pharmacy or Health Dept. Aware to provide a copy of the vaccination record if obtained from local pharmacy or Health Dept. Verbalized acceptance and understanding.  Covid-19 vaccine status: Completed vaccines  Qualifies for Shingles Vaccine? Yes   Zostavax completed Yes   Shingrix Completed?: No.    Education has been provided regarding the importance of this vaccine. Patient has been advised to call insurance company to determine out of pocket expense if they have not yet received this vaccine. Advised may also receive vaccine at local pharmacy or Health Dept. Verbalized acceptance and understanding.  Screening Tests Health Maintenance  Topic Date Due   Hepatitis C Screening  Never done   Zoster Vaccines- Shingrix (1 of 2) Never done   DEXA SCAN  Never done   Pneumonia Vaccine 70 Years old (2 - PPSV23 or PCV20) 03/03/2018   COVID-19 Vaccine (6 - Pfizer series) 01/29/2021   INFLUENZA VACCINE  08/11/2021   TETANUS/TDAP  02/26/2022    COLONOSCOPY (Pts 45-3yr Insurance coverage will need to be confirmed)  11/16/2022   MAMMOGRAM  01/24/2023   HPV VACCINES  Aged Out    Health Maintenance  Health Maintenance Due  Topic Date Due   Hepatitis C Screening  Never done   Zoster Vaccines- Shingrix (1 of 2) Never done   DEXA SCAN  Never done   Pneumonia Vaccine 70 Years old (2 - PPSV23 or PCV20) 03/03/2018   COVID-19 Vaccine (6 - Pfizer series) 01/29/2021   INFLUENZA VACCINE  08/11/2021    Colorectal cancer screening: Type of screening: Colonoscopy. Completed 11/15/2017. Repeat every 5 years  Mammogram status: Completed 01/23/2021-bilateral. Repeat every year  Bone Density status: Due Declined today. Patient states she will have this done with her next mammogram.  Lung Cancer Screening: (Low Dose CT Chest recommended if Age 70-80years, 30 pack-year currently smoking OR have quit w/in 15years.) does not qualify.     Additional Screening:  Hepatitis C Screening: does qualify; Patient to discuss with PCP  Vision Screening: Recommended annual ophthalmology exams for early detection of glaucoma and other disorders of the eye. Is the patient up to date with their annual eye exam?  Yes  Who is the provider or what is the name of the office in which the patient attends annual eye exams? Dr. RJabier Mutton Dental Screening: Recommended annual dental exams for proper oral hygiene  Community Resource Referral / Chronic Care Management: CRR required this visit?  No   CCM required this visit?  No      Plan:     I have personally reviewed and noted the following in the patient's chart:   Medical and social history Use of alcohol, tobacco or illicit drugs  Current medications and supplements including opioid prescriptions. Patient is not currently taking opioid prescriptions. Functional ability and status Nutritional status Physical activity Advanced directives List of other physicians Hospitalizations, surgeries, and ER  visits in previous 12 months Vitals Screenings to include cognitive, depression, and falls Referrals and appointments  In addition, I have reviewed and discussed with patient certain preventive protocols, quality metrics, and best practice recommendations. A written personalized care plan for preventive services as well as general preventive health recommendations were provided to patient.   Due to this being a telephonic visit, the after visit summary with patients personalized plan was offered to patient via mail or my-chart. Patient would like to access on my-chart.   MMarta Antu LPN   [id number] Nurse Health Advisor  Nurse Notes: None  Review and Agree with assessment & plan of LPN   EMackie Pai PA-C

## 2021-09-08 ENCOUNTER — Other Ambulatory Visit: Payer: Medicare Other

## 2021-09-11 ENCOUNTER — Ambulatory Visit: Payer: Medicare Other | Admitting: Medical

## 2021-09-15 ENCOUNTER — Ambulatory Visit: Payer: Medicare Other | Admitting: Medical

## 2021-11-06 ENCOUNTER — Other Ambulatory Visit (INDEPENDENT_AMBULATORY_CARE_PROVIDER_SITE_OTHER): Payer: Medicare Other

## 2021-11-06 ENCOUNTER — Other Ambulatory Visit: Payer: Self-pay | Admitting: Endocrinology

## 2021-11-06 ENCOUNTER — Telehealth: Payer: Self-pay | Admitting: Endocrinology

## 2021-11-06 DIAGNOSIS — E039 Hypothyroidism, unspecified: Secondary | ICD-10-CM

## 2021-11-06 LAB — T4, FREE: Free T4: 0.8 ng/dL (ref 0.60–1.60)

## 2021-11-06 LAB — T3, FREE: T3, Free: 3.9 pg/mL (ref 2.3–4.2)

## 2021-11-06 LAB — TSH: TSH: 1.91 u[IU]/mL (ref 0.35–5.50)

## 2021-11-06 NOTE — Telephone Encounter (Signed)
Patient has been notified and will callback to schedule appointment when she has her calendar in front of her.

## 2021-11-06 NOTE — Telephone Encounter (Signed)
Patient is getting ready to leave out of town and wants to know if she can come in at 9:00 today for labs for Thyroid.  If not today she would like to come in  11/11/2021.  Patient states she is feeling dizzy and having heart palpitations and feels it is coming from her thyroid problem.

## 2021-11-11 ENCOUNTER — Ambulatory Visit: Payer: Medicare Other | Admitting: Endocrinology

## 2021-12-15 ENCOUNTER — Other Ambulatory Visit (INDEPENDENT_AMBULATORY_CARE_PROVIDER_SITE_OTHER): Payer: Medicare Other

## 2021-12-15 DIAGNOSIS — I10 Essential (primary) hypertension: Secondary | ICD-10-CM

## 2021-12-15 DIAGNOSIS — E8881 Metabolic syndrome: Secondary | ICD-10-CM | POA: Diagnosis not present

## 2021-12-15 DIAGNOSIS — E7800 Pure hypercholesterolemia, unspecified: Secondary | ICD-10-CM | POA: Diagnosis not present

## 2021-12-15 DIAGNOSIS — E559 Vitamin D deficiency, unspecified: Secondary | ICD-10-CM | POA: Diagnosis not present

## 2021-12-15 LAB — COMPREHENSIVE METABOLIC PANEL
ALT: 16 U/L (ref 0–35)
AST: 18 U/L (ref 0–37)
Albumin: 4.3 g/dL (ref 3.5–5.2)
Alkaline Phosphatase: 81 U/L (ref 39–117)
BUN: 15 mg/dL (ref 6–23)
CO2: 30 mEq/L (ref 19–32)
Calcium: 8.9 mg/dL (ref 8.4–10.5)
Chloride: 104 mEq/L (ref 96–112)
Creatinine, Ser: 1.05 mg/dL (ref 0.40–1.20)
GFR: 54.02 mL/min — ABNORMAL LOW (ref >60.00)
Glucose, Bld: 88 mg/dL (ref 70–99)
Potassium: 4 mEq/L (ref 3.5–5.1)
Sodium: 142 mEq/L (ref 135–145)
Total Bilirubin: 0.4 mg/dL (ref 0.2–1.2)
Total Protein: 6.6 g/dL (ref 6.0–8.3)

## 2021-12-15 LAB — LIPID PANEL
Cholesterol: 236 mg/dL — ABNORMAL HIGH (ref 0–200)
HDL: 63.6 mg/dL (ref >39.00)
LDL Cholesterol: 143 mg/dL — ABNORMAL HIGH (ref 0–99)
NonHDL: 172.21
Total CHOL/HDL Ratio: 4
Triglycerides: 145 mg/dL (ref 0.0–149.0)
VLDL: 29 mg/dL (ref 0.0–40.0)

## 2021-12-20 LAB — VITAMIN D 1,25 DIHYDROXY
Vitamin D 1, 25 (OH)2 Total: 74 pg/mL — ABNORMAL HIGH (ref 18–72)
Vitamin D2 1, 25 (OH)2: <8 pg/mL
Vitamin D3 1, 25 (OH)2: 74 pg/mL

## 2021-12-21 ENCOUNTER — Encounter: Payer: Self-pay | Admitting: Medical

## 2021-12-21 ENCOUNTER — Ambulatory Visit (INDEPENDENT_AMBULATORY_CARE_PROVIDER_SITE_OTHER): Payer: Medicare Other | Admitting: Medical

## 2021-12-21 VITALS — BP 130/70 | HR 93 | Temp 98.0°F | Resp 18 | Ht 59.0 in | Wt 156.2 lb

## 2021-12-21 DIAGNOSIS — E559 Vitamin D deficiency, unspecified: Secondary | ICD-10-CM | POA: Diagnosis not present

## 2021-12-21 DIAGNOSIS — E039 Hypothyroidism, unspecified: Secondary | ICD-10-CM

## 2021-12-21 DIAGNOSIS — R944 Abnormal results of kidney function studies: Secondary | ICD-10-CM

## 2021-12-21 DIAGNOSIS — E7800 Pure hypercholesterolemia, unspecified: Secondary | ICD-10-CM

## 2021-12-21 DIAGNOSIS — D1801 Hemangioma of skin and subcutaneous tissue: Secondary | ICD-10-CM | POA: Diagnosis not present

## 2021-12-21 DIAGNOSIS — D485 Neoplasm of uncertain behavior of skin: Secondary | ICD-10-CM | POA: Diagnosis not present

## 2021-12-21 DIAGNOSIS — D2222 Melanocytic nevi of left ear and external auricular canal: Secondary | ICD-10-CM | POA: Diagnosis not present

## 2021-12-21 DIAGNOSIS — L821 Other seborrheic keratosis: Secondary | ICD-10-CM | POA: Diagnosis not present

## 2021-12-21 DIAGNOSIS — I788 Other diseases of capillaries: Secondary | ICD-10-CM | POA: Diagnosis not present

## 2021-12-21 DIAGNOSIS — D2271 Melanocytic nevi of right lower limb, including hip: Secondary | ICD-10-CM | POA: Diagnosis not present

## 2021-12-21 DIAGNOSIS — F439 Reaction to severe stress, unspecified: Secondary | ICD-10-CM

## 2021-12-21 NOTE — Patient Instructions (Addendum)
For hypothyroid please continue to follow up with Dr. Dwyane Dee. I see a note where he wanted you follow up in 4 months.  For high cholesterol you declined prescription meds. Pt wants to try omega 3 fatty acid. Please be aware your 10 year risk score is below.  The 10-year ASCVD risk score (Arnett DK, et al., 2019) is: 10%   Values used to calculate the score:     Age: 71 years     Sex: Female     Is Non-Hispanic African American: No     Diabetic: No     Tobacco smoker: No     Systolic Blood Pressure: 309 mmHg     Is BP treated: No     HDL Cholesterol: 63.6 mg/dL     Total Cholesterol: 236 mg/dL     For mild high vit d level cut back to by one tab of otc tab.  I am glad your mood is better/less stress.  Will follow your metabolic panel every 3 months to monitor your gfr in light of your varied supplament use. Please be well hydrated on day of lab work.   Follow up date to be determined after lab review. Placing future cmp. Get scheduled for labs in 3 months.

## 2021-12-21 NOTE — Progress Notes (Signed)
Subjective:    Patient ID: Karen Proctor, female    DOB: [birth year], 72 y.o.   MRN: [id number]  HPI  Pt in for follow up.  Has high cholesterol. She declines rx meds.   Mild high vit D level just recently. On otc vit D.   Metabolic panel is decreased minimally.  She has low thyroid levels. She is on levothyroxine and cytomel. Pt sees Dr. Dwyane Dee  Pt also has some stress and depressed mood related to family stress. Pt did go to somatic counseling. She did find it helpful.  Pt is taking a lot of over the counter vitamins and supplaments in order to have better cellular health.   Pt states getting some exercise. Walking dog and working 2 jobs.  Past Medical History:  Diagnosis Date   Adrenal insufficiency (HCC)    Fibromyalgia    Hypothyroidism    Inflammatory arthritis 11/15/2013   Lead toxicity    Metabolic syndrome    Postmenopausal    Vitamin D deficiency      Social History   Socioeconomic History   Marital status: Divorced    Spouse name: Not on file   Number of children: Not on file   Years of education: Not on file   Highest education level: Not on file  Occupational History   Occupation: Retired  Tobacco Use   Smoking status: Never   Smokeless tobacco: Never  Vaping Use   Vaping Use: Never used  Substance and Sexual Activity   Alcohol use: Yes    Alcohol/week: 0.0 standard drinks of alcohol    Comment: very occasionally   Drug use: No   Sexual activity: Not on file  Other Topics Concern   Not on file  Social History Narrative   Not on file   Social Determinants of Health   Financial Resource Strain: Low Risk  (09/07/2021)   Overall Financial Resource Strain (CARDIA)    Difficulty of Paying Living Expenses: Not hard at all  Food Insecurity: No Food Insecurity (09/07/2021)   Hunger Vital Sign    Worried About Running Out of Food in the Last Year: Never true    [place] in the Last Year: Never true  Transportation Needs: No  Transportation Needs (09/07/2021)   PRAPARE - Hydrologist (Medical): No    Lack of Transportation (Non-Medical): No  Physical Activity: Sufficiently Active (09/07/2021)   Exercise Vital Sign    Days of Exercise per Week: 5 days    Minutes of Exercise per Session: 30 min  Stress: Stress Concern Present (09/07/2021)   [place]    Feeling of Stress : To some extent  Social Connections: Moderately Integrated (09/07/2021)   Social Connection and Isolation Panel [NHANES]    Frequency of Communication with Friends and Family: More than three times a week    Frequency of Social Gatherings with Friends and Family: More than three times a week    Attends Religious Services: 1 to 4 times per year    Active Member of [employer] or Organizations: Yes    Attends Archivist Meetings: More than 4 times per year    Marital Status: Divorced  Intimate Partner Violence: Not At Risk (09/07/2021)   Humiliation, Afraid, Rape, and Kick questionnaire    Fear of Current or Ex-Partner: No    Emotionally Abused: No    Physically Abused: No    Sexually Abused:  No    Past Surgical History:  Procedure Laterality Date   APPENDECTOMY  1981   fibroid tumor     [place]   surgery for endometriosis and removal of fibroid tumor.   TOE SURGERY     TONSILLECTOMY  2009    Family History  Problem Relation Age of Onset   COPD Mother 82   Skin cancer Father    Colon cancer Neg Hx    Esophageal cancer Neg Hx    Rectal cancer Neg Hx    Stomach cancer Neg Hx    Thyroid disease Neg Hx     Allergies  Allergen Reactions   Gluten Meal     Current Outpatient Medications on File Prior to Visit  Medication Sig Dispense Refill   levothyroxine (SYNTHROID) 75 MCG tablet Take 1/2 tablet once weekly and 1 full tablet the other 6 days daily 90 tablet 3   liothyronine (CYTOMEL) 5 MCG  tablet TAKE ONE TABLET BY MOUTH ONCE DAILY 90 tablet 1   No current facility-administered medications on file prior to visit.    BP 130/70   Pulse 93   Temp 98 F (36.7 C)   Resp 18   Ht '4\' 11"'$  (1.499 m)   Wt 156 lb 3.2 oz (70.9 kg)   SpO2 99%   BMI 31.55 kg/m       Review of Systems  Constitutional:  Negative for chills, fatigue and fever.  Respiratory:  Negative for cough, chest tightness, shortness of breath and wheezing.   Cardiovascular:  Negative for chest pain and palpitations.  Gastrointestinal:  Negative for abdominal pain.  Musculoskeletal:  Negative for back pain.  Skin:  Negative for rash.  Hematological:  Negative for adenopathy. Does not bruise/bleed easily.  Psychiatric/Behavioral:  Negative for behavioral problems and confusion.            Objective:   Physical Exam  General Mental Status- Alert. General Appearance- Not in acute distress.   Skin General: Color- Normal Color. Moisture- Normal Moisture.  Neck Carotid Arteries- Normal color. Moisture- Normal Moisture. No carotid bruits. No JVD.  Chest and Lung Exam Auscultation: Breath Sounds:-Normal.  Cardiovascular Auscultation:Rythm- Regular. Murmurs & Other Heart Sounds:Auscultation of the heart reveals- No Murmurs.  Abdomen Inspection:-Inspeection Normal. Palpation/Percussion:Note:No mass. Palpation and Percussion of the abdomen reveal- Non Tender, Non Distended + BS, no rebound or guarding.   Neurologic Cranial Nerve exam:- CN III-XII intact(No nystagmus), symmetric smile. Strength:- 5/5 equal and symmetric strength both upper and lower extremities.       Assessment & Plan:   Patient Instructions  For hypothyroid please continue to follow up with Dr. Dwyane Dee. I see a note where he wanted you follow up in 4 months.  For high cholesterol you declined prescription meds. Pt wants to try omega 3 fatty acid. Please be aware your 10 year risk score is below.  The 10-year ASCVD risk  score (Arnett DK, et al., 2019) is: 10%   Values used to calculate the score:     Age: 68 years     Sex: Female     Is Non-Hispanic African American: No     Diabetic: No     Tobacco smoker: No     Systolic Blood Pressure: 938 mmHg     Is BP treated: No     HDL Cholesterol: 63.6 mg/dL     Total Cholesterol: 236 mg/dL     For mild high vit d  level cut back to by one tab of otc tab.  I am glad your mood is better/less stress.  Will follow your metabolic panel every 3 months to monitor your gfr in light of your varied supplament use. Please be well hydrated on day of lab work.   Follow up date to be determined after lab review. Placing future cmp. Get scheduled for labs in 3 months.       Mackie Pai, PA-C

## 2022-01-26 NOTE — Telephone Encounter (Signed)
I have a blank order form form [facility]. Some writing beside bone density. Last done 04/06/2007. Page states reason for the exam. I don't see that I ordered dexscan recently and can't see old report. Did she have osteopenia or porosis. Does pt know? If she does not know will put in screening diagnosis. Has she been trying to get the study.  Mackie Pai, PA-C

## 2022-01-27 NOTE — Telephone Encounter (Signed)
Spoke with Lanna Poche , stated pt requested dexascan and mammogram -- orders faxed to [facility]

## 2022-01-29 DIAGNOSIS — Z1231 Encounter for screening mammogram for malignant neoplasm of breast: Secondary | ICD-10-CM | POA: Diagnosis not present

## 2022-01-29 DIAGNOSIS — M8589 Other specified disorders of bone density and structure, multiple sites: Secondary | ICD-10-CM | POA: Diagnosis not present

## 2022-01-29 DIAGNOSIS — Z780 Asymptomatic menopausal state: Secondary | ICD-10-CM | POA: Diagnosis not present

## 2022-01-29 LAB — HM DEXA SCAN

## 2022-02-15 DIAGNOSIS — K080 Exfoliation of teeth due to systemic causes: Secondary | ICD-10-CM | POA: Diagnosis not present

## 2022-03-15 ENCOUNTER — Other Ambulatory Visit: Payer: Self-pay | Admitting: Endocrinology

## 2022-03-15 DIAGNOSIS — E039 Hypothyroidism, unspecified: Secondary | ICD-10-CM

## 2022-03-16 ENCOUNTER — Other Ambulatory Visit: Payer: Self-pay | Admitting: Endocrinology

## 2022-03-16 DIAGNOSIS — E039 Hypothyroidism, unspecified: Secondary | ICD-10-CM

## 2022-03-22 ENCOUNTER — Other Ambulatory Visit (INDEPENDENT_AMBULATORY_CARE_PROVIDER_SITE_OTHER): Payer: Medicare Other

## 2022-03-22 DIAGNOSIS — R944 Abnormal results of kidney function studies: Secondary | ICD-10-CM | POA: Diagnosis not present

## 2022-03-22 LAB — COMPREHENSIVE METABOLIC PANEL
ALT: 18 U/L (ref 0–35)
AST: 18 U/L (ref 0–37)
Albumin: 4.1 g/dL (ref 3.5–5.2)
Alkaline Phosphatase: 70 U/L (ref 39–117)
BUN: 21 mg/dL (ref 6–23)
CO2: 29 mEq/L (ref 19–32)
Calcium: 9.3 mg/dL (ref 8.4–10.5)
Chloride: 104 mEq/L (ref 96–112)
Creatinine, Ser: 0.99 mg/dL (ref 0.40–1.20)
GFR: 57.86 mL/min — ABNORMAL LOW (ref >60.00)
Glucose, Bld: 93 mg/dL (ref 70–99)
Potassium: 4 mEq/L (ref 3.5–5.1)
Sodium: 140 mEq/L (ref 135–145)
Total Bilirubin: 0.4 mg/dL (ref 0.2–1.2)
Total Protein: 6.5 g/dL (ref 6.0–8.3)

## 2022-05-17 ENCOUNTER — Ambulatory Visit (INDEPENDENT_AMBULATORY_CARE_PROVIDER_SITE_OTHER): Payer: Medicare Other | Admitting: Medical

## 2022-05-17 ENCOUNTER — Ambulatory Visit (HOSPITAL_BASED_OUTPATIENT_CLINIC_OR_DEPARTMENT_OTHER)
Admission: RE | Admit: 2022-05-17 | Discharge: 2022-05-17 | Disposition: A | Discharge status: Home / Self Care | Payer: Medicare Other | Source: Ambulatory Visit | Attending: Medical | Admitting: Medical

## 2022-05-17 ENCOUNTER — Encounter: Payer: Self-pay | Admitting: Medical

## 2022-05-17 VITALS — BP 130/77 | HR 72 | Ht 59.0 in | Wt 153.2 lb

## 2022-05-17 DIAGNOSIS — M25551 Pain in right hip: Secondary | ICD-10-CM | POA: Diagnosis not present

## 2022-05-17 DIAGNOSIS — M47816 Spondylosis without myelopathy or radiculopathy, lumbar region: Secondary | ICD-10-CM | POA: Diagnosis not present

## 2022-05-17 DIAGNOSIS — M79645 Pain in left finger(s): Secondary | ICD-10-CM | POA: Diagnosis not present

## 2022-05-17 DIAGNOSIS — M19042 Primary osteoarthritis, left hand: Secondary | ICD-10-CM | POA: Diagnosis not present

## 2022-05-17 DIAGNOSIS — M8580 Other specified disorders of bone density and structure, unspecified site: Secondary | ICD-10-CM | POA: Diagnosis not present

## 2022-05-17 DIAGNOSIS — M5441 Lumbago with sciatica, right side: Secondary | ICD-10-CM | POA: Diagnosis not present

## 2022-05-17 NOTE — Addendum Note (Signed)
Addended by: Gwenevere Abbot on: 05/17/2022 05:15 PM   Modules accepted: Orders

## 2022-05-17 NOTE — Progress Notes (Signed)
Subjective:    Patient ID: Karen Proctor, female    DOB: [birth year], 72 y.o.   MRN: [id number]  HPI  Pt told MA below.  Onset 6 months or longer Starts as a cramping pain, Shooting pain into right leg Hurts more after laying down or sitting for long periods of time Occasionally will feel same pain on left side Bump on left pointer finger onset 2 weeks tender to touch     Pt describes pain in rt hip that sometimes radiates to groin area and radiates down to her rt ankle. She states pain is chronic pain. Not always same intensity. Pain worse after sitting long time and then standing up.  Pt notes also some sciatica area pain. Rt hip pain is more.   Pt does have some osteopenia on last labs. I had advised below.  "Pt bone dexa scan showed osteopenia. Can send in fosamax to use daily if she is willing to take(let me know what she says). You could offer appointment to discuss risk and benefit of med. Continue vit d over the counter and can use use calcium 1200 mg daily over the counter. "  Left index finger small nodule on left index finger. Slight pain only if presses again area.   Review of Systems  Constitutional:  Negative for chills, fatigue and fever.  HENT:  Negative for congestion, ear discharge and ear pain.   Respiratory:  Negative for cough, chest tightness, shortness of breath and wheezing.   Cardiovascular:  Negative for chest pain and palpitations.  Gastrointestinal:  Negative for abdominal pain and blood in stool.  Genitourinary:  Negative for dysuria and enuresis.  Musculoskeletal:  Negative for back pain.       Rt sciatica area pain. Rt hip pain. Also radicular pain.  Skin:  Negative for rash.  Neurological:  Negative for dizziness, seizures, syncope, weakness, light-headedness and headaches.  Hematological:  Negative for adenopathy. Does not bruise/bleed easily.  Psychiatric/Behavioral:  Negative for behavioral problems, confusion and sleep disturbance. The  patient is not nervous/anxious.     Past Medical History:  Diagnosis Date   Adrenal insufficiency (HCC)    Fibromyalgia    Hypothyroidism    Inflammatory arthritis 11/15/2013   Lead toxicity    Metabolic syndrome    Postmenopausal    Vitamin D deficiency      Social History   Socioeconomic History   Marital status: Divorced    Spouse name: Not on file   Number of children: Not on file   Years of education: Not on file   Highest education level: Master's degree (e.g., MA, MS, MEng, MEd, MSW, MBA)  Occupational History   Occupation: Retired  Tobacco Use   Smoking status: Never   Smokeless tobacco: Never  Vaping Use   Vaping Use: Never used  Substance and Sexual Activity   Alcohol use: Yes    Alcohol/week: 0.0 standard drinks of alcohol    Comment: very occasionally   Drug use: No   Sexual activity: Not on file  Other Topics Concern   Not on file  Social History Narrative   Not on file   Social Determinants of Health   Financial Resource Strain: Low Risk  (05/16/2022)   Overall Financial Resource Strain (CARDIA)    Difficulty of Paying Living Expenses: Not very hard  Food Insecurity: No Food Insecurity (05/16/2022)   Hunger Vital Sign    Worried About Running Out of Food in the Last Year: Never true  Ran Out of Food in the Last Year: Never true  Transportation Needs: No Transportation Needs (05/16/2022)   PRAPARE - Administrator, Civil Service (Medical): No    Lack of Transportation (Non-Medical): No  Physical Activity: Insufficiently Active (05/16/2022)   Exercise Vital Sign    Days of Exercise per Week: 5 days    Minutes of Exercise per Session: 20 min  Stress: No Stress Concern Present (05/16/2022)   [employer] of Occupational Health - Occupational Stress Questionnaire    Feeling of Stress : Only a little  Social Connections: Moderately Integrated (05/16/2022)   Social Connection and Isolation Panel [NHANES]    Frequency of Communication with  Friends and Family: More than three times a week    Frequency of Social Gatherings with Friends and Family: More than three times a week    Attends Religious Services: More than 4 times per year    Active Member of [employer] or Organizations: Yes    Attends Engineer, structural: More than 4 times per year    Marital Status: Divorced  Intimate Partner Violence: Not At Risk (09/07/2021)   Humiliation, Afraid, Rape, and Kick questionnaire    Fear of Current or Ex-Partner: No    Emotionally Abused: No    Physically Abused: No    Sexually Abused: No    Past Surgical History:  Procedure Laterality Date   APPENDECTOMY  1981   fibroid tumor     MYOMECTOMY  1993   OTHER SURGICAL HISTORY  1990   surgery for endometriosis and removal of fibroid tumor.   TOE SURGERY     TONSILLECTOMY  2009    Family History  Problem Relation Age of Onset   COPD Mother 37   Skin cancer Father    Colon cancer Neg Hx    Esophageal cancer Neg Hx    Rectal cancer Neg Hx    Stomach cancer Neg Hx    Thyroid disease Neg Hx     Allergies  Allergen Reactions   Gluten Meal     Current Outpatient Medications on File Prior to Visit  Medication Sig Dispense Refill   levothyroxine (SYNTHROID) 75 MCG tablet Take 1/2 tablet once weekly and 1 full tablet the other 6 days daily 90 tablet 3   liothyronine (CYTOMEL) 5 MCG tablet TAKE ONE TABLET BY MOUTH ONCE DAILY 90 tablet 0   No current facility-administered medications on file prior to visit.    BP 130/77 (BP Location: Left Arm, Patient Position: Sitting, Cuff Size: Normal)   Pulse 72   Ht 4\' 11"  (1.499 m)   Wt 153 lb 3.2 oz (69.5 kg)   SpO2 100%   BMI 30.94 kg/m        Objective:   Physical Exam  General Appearance- Not in acute distress.  Neck- from  Chest and Lung Exam Auscultation: Breath sounds:-Normal. Clear even and unlabored. Adventitious sounds:- No Adventitious sounds.  Cardiovascular Auscultation:Rythm - Regular, rate and  rythm. Heart Sounds -Normal heart sounds.  Abdomen Inspection:-Inspection Normal.  Palpation/Perucssion: Palpation and Percussion of the abdomen reveal- Non Tender, No Rebound tenderness, No rigidity(Guarding) and No Palpable abdominal masses.  Liver:-Normal.  Spleen:- Normal.   Back Mid lumbar spine tenderness to palpation. Pain on straight leg lift. Pain on lateral movements and flexion/extension of the spine.  Lower ext neurologic  L5-S1 sensation intact bilaterally. Normal patellar reflexes bilaterally. No foot drop bilaterally.     Assessment & Plan:   Patient Instructions  1. Pain of right hip Evaluate the joint space to see if narrowed. If not narrowed consider trochanteric bursitis. - DG HIP UNILAT WITH PELVIS 2-3 VIEWS RIGHT; Future  2. Acute right-sided low back pain with right-sided sciatica Xray of lumbar spine to assess disc spaces and neural foramen.  - DG Lumbar Spine 2-3 Views; Future  Can use tylenol for pain and low dose ibuprofen on occasion if needed.(But not daily as discussed) consider sport med referral.  3. Osteopenia, unspecified location Discussed fosamax benefit vs risk. Presently declining. Advised calcium, vit d, get sun exposure and weight bearing exercises. If you change your mind let me know.  Follow up date to be determined after xray          Esperanza Richters, PA-C

## 2022-05-17 NOTE — Addendum Note (Signed)
Addended by: Gwenevere Abbot on: 05/17/2022 09:25 AM   Modules accepted: Orders

## 2022-05-17 NOTE — Patient Instructions (Signed)
1. Pain of right hip Evaluate the joint space to see if narrowed. If not narrowed consider trochanteric bursitis. - DG HIP UNILAT WITH PELVIS 2-3 VIEWS RIGHT; Future  2. Acute right-sided low back pain with right-sided sciatica Xray of lumbar spine to assess disc spaces and neural foramen.  - DG Lumbar Spine 2-3 Views; Future  Can use tylenol for pain and low dose ibuprofen on occasion if needed.(But not daily as discussed) consider sport med referral.  3. Osteopenia, unspecified location Discussed fosamax benefit vs risk. Presently declining. Advised calcium, vit d, get sun exposure and weight bearing exercises. If you change your mind let me know.  Follow up date to be determined after xray

## 2022-05-24 ENCOUNTER — Other Ambulatory Visit: Payer: Self-pay | Admitting: Endocrinology

## 2022-05-24 DIAGNOSIS — E039 Hypothyroidism, unspecified: Secondary | ICD-10-CM

## 2022-05-28 ENCOUNTER — Other Ambulatory Visit: Payer: Medicare Other

## 2022-05-28 ENCOUNTER — Other Ambulatory Visit (INDEPENDENT_AMBULATORY_CARE_PROVIDER_SITE_OTHER): Payer: Medicare Other

## 2022-05-28 DIAGNOSIS — E039 Hypothyroidism, unspecified: Secondary | ICD-10-CM

## 2022-05-28 LAB — TSH: TSH: 1.31 u[IU]/mL (ref 0.35–5.50)

## 2022-05-28 LAB — T3, FREE: T3, Free: 2.7 pg/mL (ref 2.3–4.2)

## 2022-05-28 LAB — T4, FREE: Free T4: 0.85 ng/dL (ref 0.60–1.60)

## 2022-06-02 ENCOUNTER — Ambulatory Visit: Payer: Medicare Other | Admitting: Endocrinology

## 2022-06-02 DIAGNOSIS — E039 Hypothyroidism, unspecified: Secondary | ICD-10-CM

## 2022-06-02 MED ORDER — LIOTHYRONINE SODIUM 5 MCG PO TABS
5.0000 ug | ORAL_TABLET | Freq: Every day | ORAL | 1 refills | Status: DC
Start: 2022-06-02 — End: 2023-01-17

## 2022-06-02 MED ORDER — LEVOTHYROXINE SODIUM 75 MCG PO TABS: ORAL_TABLET | ORAL | 3 refills | Status: DC

## 2022-06-02 NOTE — Patient Instructions (Signed)
Take Cytomel daily

## 2022-06-02 NOTE — Progress Notes (Signed)
Patient ID: Karen Proctor, female   DOB: 02-11-1950, 72 y.o.   MRN: [id number]            Reason for Appointment: Hypothyroidism, follow-up visit    History of Present Illness:   Hypothyroidism was first diagnosed in 2004  At the time of diagnosis patient was having symptoms of significant fatigue but does not remember any other symptoms typical of hypothyroidism She was also having generalized aching symptoms She was seen by a holistic physician who told her that she was hypothyroid Not clear if she had a baseline thyroid enlargement or abnormal antibody studies  She was treated with Armour Thyroid initially with improvement in her thyroid symptoms Subsequently she was also tried by another physician on levothyroxine but she felt more tired with this Since about 2011 or so she had been taking a combination of levothyroxine and Cytomel  The patient is partly followed in the [place] where she lives most of the time by a local endocrinologist Previous TSH levels done in the [place] since 2018 showed that TSH level had been low normal or low ranging between 0.11 and 0.54 Apparently in 2/21 in [place] she was having symptoms of chest pain and cardiac evaluation was negative.  She was told that her symptoms were related to side effects of Euthyrox Review of her labs indicates that her TSH had been low in 07/01/2018 and 08/31/2018 but normal in 7/20  RECENT HISTORY:  In 2021 she was switched to Armour Thyroid equivalent capsules in the [place].  On her follow-up visit in 9/21  she was having some feeling of weakness, chest discomfort similar to when she had over replacement with thyroid hormones previously  Since her TSH was low at 0.11 without any increase in her T4/T3 levels she was switched to a combination of levothyroxine and Cytomel She is on 75 mcg of levothyroxine and 5 mcg of liothyronine  Currently skipping 1 tablet per week on her LEVOTHYROXINE since 11/21 when  her TSH was low normal On her own she is also skipping the liothyronine on Sundays  Has not had any follow-up since 10/2020  She does not feel fatigued or stressed recently and overall feeling fairly good, previously had stress from various issues No cold intolerance or hair loss Apparently she starts getting palpitations and chest pain when her thyroid levels are somewhat high  Her labs again show a normal TSH  Also has normal T4 although free T3 slightly lower, labs done at 9 AM         Patient's weight history is as follows:  Wt Readings from Last 3 Encounters:  06/02/22 156 lb (70.8 kg)  05/17/22 153 lb 3.2 oz (69.5 kg)  12/21/21 156 lb 3.2 oz (70.9 kg)    Thyroid function results have been as follows:  Lab Results  Component Value Date   TSH 1.31 05/28/2022   TSH 1.91 11/06/2021   TSH 1.89 03/30/2021   TSH 2.10 10/21/2020   FREET4 0.85 05/28/2022   FREET4 0.80 11/06/2021   FREET4 0.84 03/30/2021   FREET4 0.67 10/21/2020   T3FREE 2.7 05/28/2022   T3FREE 3.9 11/06/2021   T3FREE 3.4 03/30/2021   Lab Results  Component Value Date   T3FREE 2.7 05/28/2022   T3FREE 3.9 11/06/2021   T3FREE 3.4 03/30/2021      Past Medical History:  Diagnosis Date   Adrenal insufficiency (HCC)    Fibromyalgia    Hypothyroidism    Inflammatory arthritis 11/15/2013   Lead  toxicity    Metabolic syndrome    Postmenopausal    Vitamin D deficiency     Past Surgical History:  Procedure Laterality Date   APPENDECTOMY  1981   fibroid tumor     MYOMECTOMY  1993   OTHER SURGICAL HISTORY  1990   surgery for endometriosis and removal of fibroid tumor.   TOE SURGERY     TONSILLECTOMY  2009    Family History  Problem Relation Age of Onset   COPD Mother 52   Skin cancer Father    Colon cancer Neg Hx    Esophageal cancer Neg Hx    Rectal cancer Neg Hx    Stomach cancer Neg Hx    Thyroid disease Neg Hx     Social History:  reports that she has never smoked. She has never used  smokeless tobacco. She reports current alcohol use. She reports that she does not use drugs.  Allergies:  Allergies  Allergen Reactions   Gluten Meal     Allergies as of 06/02/2022       Reactions   Gluten Meal         Medication List        Accurate as of Jun 02, 2022  9:25 AM. If you have any questions, ask your nurse or doctor.          levothyroxine 75 MCG tablet Commonly known as: SYNTHROID Take 1/2 tablet once weekly and 1 full tablet the other 6 days daily   liothyronine 5 MCG tablet Commonly known as: CYTOMEL TAKE ONE TABLET BY MOUTH ONCE DAILY         Review of Systems  Has history of fibromyalgia        Examination:    BP 130/80   Pulse 97   Ht 4\' 11"  (1.499 m)   Wt 156 lb (70.8 kg)   SpO2 98%   BMI 31.51 kg/m    Thyroid not palpable Biceps reflexes show slightly brisk relaxation No peripheral edema  Assessment:  HYPOTHYROIDISM, unclear whether she has secondary or primary hypothyroidism She does subjectively better with combination of levothyroxine and liothyronine which she was given when she was in [place]  She now has been on a stable regimen of levothyroxine and liothyronine for the last couple of years at least Subjectively doing well  Thyroid levels are normal except for slightly low T3 level  OSTEOPENIA with T-score -2.3: Managed by PCP She does not want to take any pharmacological treatment despite discussion about potential fracture reduction  PLAN:   She will continue to take 6 tablets a week of levothyroxine 75 mcg However she continues to take her 5 mcg of liothyronine 7 days a week  Follow-up in 1 year unless having any unusual symptoms   Reather Littler 06/02/2022, 9:25 AM    Note: This office note was prepared with Dragon voice recognition system technology. Any transcriptional errors that result from this process are unintentional.

## 2022-06-08 NOTE — Progress Notes (Unsigned)
[facility] [address] [place] [address] Phone: [phone] Subjective:   [name], am serving as a scribe for Dr. Antoine Primas.  I'm seeing this patient by the request  of:  [name], Karen Dredge, PA-C  CC: Right hip and back pain  [id number]  Karen Proctor is a 72 y.o. female coming in with complaint of R hip and R back pain. Patient states pain in right leg. In glute max and GT and radiates down the leg. Pain has been there for a while. Most painful at night when laying on the right side. Leg feels weak during the day, but pain not as intense. Can't lead with right leg. Looking for management. In the last four months has gotten worse.  Patient did have lumbar x-rays that were independently visualized by me.  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    Past Medical History:  Diagnosis Date   Adrenal insufficiency (HCC)    Fibromyalgia    Hypothyroidism    Inflammatory arthritis 11/15/2013   Lead toxicity    Metabolic syndrome    Postmenopausal    Vitamin D deficiency    Past Surgical History:  Procedure Laterality Date   APPENDECTOMY  1981   fibroid tumor     MYOMECTOMY  1993   OTHER SURGICAL HISTORY  1990   surgery for endometriosis and removal of fibroid tumor.   TOE SURGERY     TONSILLECTOMY  2009   Social History   Socioeconomic History   Marital status: Divorced    Spouse name: Not on file   Number of children: Not on file   Years of education: Not on file   Highest education level: Master's degree (e.g., MA, MS, MEng, MEd, MSW, MBA)  Occupational History   Occupation: Retired  Tobacco Use   Smoking status: Never   Smokeless tobacco: Never  Vaping Use   Vaping Use: Never used  Substance and Sexual Activity   Alcohol use: Yes    Alcohol/week: 0.0 standard drinks of alcohol    Comment: very occasionally   Drug use: No   Sexual activity: Not on file  Other Topics  Concern   Not on file  Social History Narrative   Not on file   Social Determinants of Health   Financial Resource Strain: Low Risk  (05/16/2022)   Overall Financial Resource Strain (CARDIA)    Difficulty of Paying Living Expenses: Not very hard  Food Insecurity: No Food Insecurity (05/16/2022)   Hunger Vital Sign    Worried About Running Out of Food in the Last Year: Never true    Ran Out of Food in the Last Year: Never true  Transportation Needs: No Transportation Needs (05/16/2022)   PRAPARE - Administrator, Civil Service (Medical): No    Lack of Transportation (Non-Medical): No  Physical Activity: Insufficiently Active (05/16/2022)   Exercise Vital Sign    Days of Exercise per Week: 5 days    Minutes of Exercise per Session: 20 min  Stress: No Stress Concern Present (05/16/2022)   [employer] of Occupational Health - Occupational Stress Questionnaire    Feeling of Stress : Only a little  Social Connections: Moderately Integrated (05/16/2022)   Social Connection and Isolation Panel [NHANES]    Frequency of Communication with Friends and Family: More than three times a week    Frequency of Social Gatherings with Friends and Family: More than three times a  week    Attends Religious Services: More than 4 times per year    Active Member of Clubs or Organizations: Yes    Attends Engineer, structural: More than 4 times per year    Marital Status: Divorced   Allergies  Allergen Reactions   Gluten Meal    Family History  Problem Relation Age of Onset   COPD Mother 29   Skin cancer Father    Colon cancer Neg Hx    Esophageal cancer Neg Hx    Rectal cancer Neg Hx    Stomach cancer Neg Hx    Thyroid disease Neg Hx     Current Outpatient Medications (Endocrine & Metabolic):    levothyroxine (SYNTHROID) 75 MCG tablet, Take 1/2 tablet once weekly and 1 full tablet the other 6 days daily   liothyronine (CYTOMEL) 5 MCG tablet, Take 1 tablet (5 mcg total) by mouth  daily.        Reviewed prior external information including notes and imaging from  primary care provider As well as notes that were available from care everywhere and other healthcare systems.  Past medical history, social, surgical and family history all reviewed in electronic medical record.  No pertanent information unless stated regarding to the chief complaint.   Review of Systems:  No headache, visual changes, nausea, vomiting, diarrhea, constipation, dizziness, abdominal pain, skin rash, fevers, chills, night sweats, weight loss, swollen lymph nodes, body aches, joint swelling, chest pain, shortness of breath, mood changes. POSITIVE muscle aches  Objective  There were no vitals taken for this visit.   General: No apparent distress alert and oriented x3 mood and affect normal, dressed appropriately.  HEENT: Pupils equal, extraocular movements intact  Respiratory: Patient's speak in full sentences and does not appear short of breath  Cardiovascular: No lower extremity edema, non tender, no erythema  Low back exam shows loss lordosis noted.  Does have some tenderness to palpation in the paraspinal musculature of the lumbar spine.  Patient does have positive Pearlean Brownie.  Severe tenderness to palpation of the greater trochanteric area.    After verbal consent patient was prepped with alcohol swab and with a 21-gauge 2 inch needle injected into the right greater trochanteric area with 2 cc of 0.5% Marcaine and 1 cc of Kenalog 40 mg/mL.  No blood loss.  Band-Aid placed.  Postinjection instructions given  [id number]; 15 additional minutes spent for Therapeutic exercises as stated in above notes.  This included exercises focusing on stretching, strengthening, with significant focus on eccentric aspects.   Long term goals include an improvement in range of motion, strength, endurance as well as avoiding reinjury. Patient's frequency would include in 1-2 times a day, 3-5 times a week for a duration  of 6-12 weeks. Hip strengthening exercises which included:  Pelvic tilt/bracing to help with proper recruitment of the lower abs and pelvic floor muscles  Glute strengthening to properly contract glutes without over-engaging low back and hamstrings - prone hip extension and glute bridge exercises Proper stretching techniques to increase effectiveness for the hip flexors, groin, quads, piriformic and low back when appropriate   Proper technique shown and discussed handout in great detail with ATC.  All questions were discussed and answered.     Impression and Recommendations:    The above documentation has been reviewed and is accurate and complete Judi Saa, DO

## 2022-06-10 ENCOUNTER — Encounter: Payer: Self-pay | Admitting: Family Medicine

## 2022-06-10 ENCOUNTER — Ambulatory Visit: Payer: Medicare Other | Admitting: Family Medicine

## 2022-06-10 VITALS — BP 108/82 | HR 88 | Ht 59.0 in | Wt 156.0 lb

## 2022-06-10 DIAGNOSIS — M545 Low back pain, unspecified: Secondary | ICD-10-CM

## 2022-06-10 DIAGNOSIS — M25551 Pain in right hip: Secondary | ICD-10-CM | POA: Diagnosis not present

## 2022-06-10 DIAGNOSIS — M7061 Trochanteric bursitis, right hip: Secondary | ICD-10-CM

## 2022-06-10 NOTE — Patient Instructions (Addendum)
Do prescribed exercises at least 3x a week PT referral Injection in GT today Ice See you again in 6-8 weeks

## 2022-06-10 NOTE — Assessment & Plan Note (Signed)
Patient given injection and tolerated the procedure well, discussed icing regimen and home exercises, which activities to do and which ones to avoid.  Patient will start formal physical therapy as well.  Discussed gabapentin for the possibility of lumbar radiculopathy with patient's x-rays show some degenerative disc disease.  Follow-up again in 8 weeks.  Worsening pain will consider the possibility of further imaging.

## 2022-07-06 ENCOUNTER — Encounter (HOSPITAL_BASED_OUTPATIENT_CLINIC_OR_DEPARTMENT_OTHER): Payer: Self-pay | Admitting: Physical Therapy

## 2022-07-06 ENCOUNTER — Ambulatory Visit (HOSPITAL_BASED_OUTPATIENT_CLINIC_OR_DEPARTMENT_OTHER): Payer: Medicare Other | Attending: Medical | Admitting: Physical Therapy

## 2022-07-06 ENCOUNTER — Other Ambulatory Visit: Payer: Self-pay

## 2022-07-06 DIAGNOSIS — M6281 Muscle weakness (generalized): Secondary | ICD-10-CM | POA: Insufficient documentation

## 2022-07-06 DIAGNOSIS — M25551 Pain in right hip: Secondary | ICD-10-CM | POA: Insufficient documentation

## 2022-07-06 DIAGNOSIS — R262 Difficulty in walking, not elsewhere classified: Secondary | ICD-10-CM | POA: Insufficient documentation

## 2022-07-06 DIAGNOSIS — M545 Low back pain, unspecified: Secondary | ICD-10-CM | POA: Insufficient documentation
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
## 2022-07-12 ENCOUNTER — Encounter (HOSPITAL_BASED_OUTPATIENT_CLINIC_OR_DEPARTMENT_OTHER): Payer: Self-pay | Admitting: Physical Therapy

## 2022-07-12 ENCOUNTER — Ambulatory Visit (HOSPITAL_BASED_OUTPATIENT_CLINIC_OR_DEPARTMENT_OTHER): Payer: Medicare Other | Attending: Medical | Admitting: Physical Therapy

## 2022-07-12 DIAGNOSIS — M6281 Muscle weakness (generalized): Secondary | ICD-10-CM | POA: Insufficient documentation

## 2022-07-12 DIAGNOSIS — M545 Low back pain, unspecified: Secondary | ICD-10-CM | POA: Diagnosis not present

## 2022-07-12 DIAGNOSIS — R262 Difficulty in walking, not elsewhere classified: Secondary | ICD-10-CM | POA: Diagnosis not present
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
## 2022-07-20 ENCOUNTER — Ambulatory Visit (HOSPITAL_BASED_OUTPATIENT_CLINIC_OR_DEPARTMENT_OTHER): Payer: Medicare Other

## 2022-07-20 ENCOUNTER — Encounter (HOSPITAL_BASED_OUTPATIENT_CLINIC_OR_DEPARTMENT_OTHER): Payer: Self-pay

## 2022-07-20 DIAGNOSIS — M545 Low back pain, unspecified: Secondary | ICD-10-CM

## 2022-07-20 DIAGNOSIS — M6281 Muscle weakness (generalized): Secondary | ICD-10-CM | POA: Diagnosis not present

## 2022-07-20 DIAGNOSIS — R262 Difficulty in walking, not elsewhere classified: Secondary | ICD-10-CM
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
## 2022-07-23 ENCOUNTER — Encounter (HOSPITAL_BASED_OUTPATIENT_CLINIC_OR_DEPARTMENT_OTHER): Payer: Medicare Other

## 2022-07-26 ENCOUNTER — Ambulatory Visit (HOSPITAL_BASED_OUTPATIENT_CLINIC_OR_DEPARTMENT_OTHER): Payer: Medicare Other

## 2022-07-26 ENCOUNTER — Encounter (HOSPITAL_BASED_OUTPATIENT_CLINIC_OR_DEPARTMENT_OTHER): Payer: Self-pay

## 2022-07-26 DIAGNOSIS — M6281 Muscle weakness (generalized): Secondary | ICD-10-CM

## 2022-07-26 DIAGNOSIS — R262 Difficulty in walking, not elsewhere classified: Secondary | ICD-10-CM

## 2022-07-26 DIAGNOSIS — M545 Low back pain, unspecified: Secondary | ICD-10-CM

## 2022-07-26 NOTE — Progress Notes (Unsigned)
[facility] [address] [place] [address] Phone: [phone] Subjective:   Karen Proctor, am serving as a scribe for Dr. Antoine Primas.  I'm seeing this patient by the request  of:  [name], Karen Proctor  CC: Right hip pain  [id number]  06/10/2022 Patient given injection and tolerated the procedure well, discussed icing regimen and home exercises, which activities to do and which ones to avoid. Patient will start formal physical therapy as well. Discussed gabapentin for the possibility of lumbar radiculopathy with patient's x-rays show some degenerative disc disease. Follow-up again in 8 weeks. Worsening pain will consider the possibility of further imaging.   Update 07/27/2022 Karen Proctor is a 72 y.o. female coming in with complaint of R hip pain. Has been in PT at [facility]. Patient states doing better. Pt is going well. Focus has been on piriformis. Injection did help.       Past Medical History:  Diagnosis Date   Adrenal insufficiency (HCC)    Fibromyalgia    Hypothyroidism    Inflammatory arthritis 11/15/2013   Lead toxicity    Metabolic syndrome    Postmenopausal    Vitamin D deficiency    Past Surgical History:  Procedure Laterality Date   APPENDECTOMY  1981   fibroid tumor     MYOMECTOMY  1993   OTHER SURGICAL HISTORY  1990   surgery for endometriosis and removal of fibroid tumor.   TOE SURGERY     TONSILLECTOMY  2009   Social History   Socioeconomic History   Marital status: Divorced    Spouse name: Not on file   Number of children: Not on file   Years of education: Not on file   Highest education level: Master's degree (e.g., MA, MS, MEng, MEd, MSW, MBA)  Occupational History   Occupation: Retired  Tobacco Use   Smoking status: Never   Smokeless tobacco: Never  Vaping Use   Vaping status: Never Used  Substance and Sexual Activity   Alcohol use: Yes    Alcohol/week: 0.0 standard drinks of alcohol     Comment: very occasionally   Drug use: No   Sexual activity: Not on file  Other Topics Concern   Not on file  Social History Narrative   Not on file   Social Determinants of Health   Financial Resource Strain: Low Risk  (05/16/2022)   Overall Financial Resource Strain (CARDIA)    Difficulty of Paying Living Expenses: Not very hard  Food Insecurity: No Food Insecurity (05/16/2022)   Hunger Vital Sign    Worried About Running Out of Food in the Last Year: Never true    Ran Out of Food in the Last Year: Never true  Transportation Needs: No Transportation Needs (05/16/2022)   PRAPARE - Administrator, Civil Service (Medical): No    Lack of Transportation (Non-Medical): No  Physical Activity: Insufficiently Active (05/16/2022)   Exercise Vital Sign    Days of Exercise per Week: 5 days    Minutes of Exercise per Session: 20 min  Stress: No Stress Concern Present (05/16/2022)   [employer] of Occupational Health - Occupational Stress Questionnaire    Feeling of Stress : Only a little  Social Connections: Moderately Integrated (05/16/2022)   Social Connection and Isolation Panel [NHANES]    Frequency of Communication with Friends and Family: More than three times a week    Frequency of Social Gatherings with Friends and Family: More than three  times a week    Attends Religious Services: More than 4 times per year    Active Member of Clubs or Organizations: Yes    Attends Engineer, structural: More than 4 times per year    Marital Status: Divorced   Allergies  Allergen Reactions   Gluten Meal    Family History  Problem Relation Age of Onset   COPD Mother 40   Skin cancer Father    Colon cancer Neg Hx    Esophageal cancer Neg Hx    Rectal cancer Neg Hx    Stomach cancer Neg Hx    Thyroid disease Neg Hx     Current Outpatient Medications (Endocrine & Metabolic):    levothyroxine (SYNTHROID) 75 MCG tablet, Take 1/2 tablet once weekly and 1 full tablet the  other 6 days daily   liothyronine (CYTOMEL) 5 MCG tablet, Take 1 tablet (5 mcg total) by mouth daily.        Reviewed prior external information including notes and imaging from  primary care provider As well as notes that were available from care everywhere and other healthcare systems.  Past medical history, social, surgical and family history all reviewed in electronic medical record.  No pertanent information unless stated regarding to the chief complaint.   Review of Systems:  No headache, visual changes, nausea, vomiting, diarrhea, constipation, dizziness, abdominal pain, skin rash, fevers, chills, night sweats, weight loss, swollen lymph nodes, body aches, joint swelling, chest pain, shortness of breath, mood changes. POSITIVE muscle aches  Objective  Blood pressure 110/72, pulse 89, height 4\' 11"  (1.499 m), weight 155 lb (70.3 kg), SpO2 96%.   General: No apparent distress alert and oriented x3 mood and affect normal, dressed appropriately.  HEENT: Pupils equal, extraocular movements intact  Respiratory: Patient's speak in full sentences and does not appear short of breath  Cardiovascular: No lower extremity edema, non tender, no erythema  Right hip exam still shows some mild limited range of motion in internal and external range of motion.  Still tenderness over the piriformis and the gluteal area more lateral.    Impression and Recommendations:     The above documentation has been reviewed and is accurate and complete Karen Saa, DO
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
## 2022-07-27 ENCOUNTER — Ambulatory Visit: Payer: Medicare Other | Admitting: Family Medicine

## 2022-07-27 VITALS — BP 110/72 | HR 89 | Ht 59.0 in | Wt 155.0 lb

## 2022-07-27 DIAGNOSIS — M7061 Trochanteric bursitis, right hip: Secondary | ICD-10-CM | POA: Diagnosis not present

## 2022-07-27 NOTE — Patient Instructions (Signed)
Good to see you  Ice 20 minutes 2 times daily. Usually after activity and before bed. Tennis ball is best friend See me again in 2 months

## 2022-07-27 NOTE — Assessment & Plan Note (Signed)
Making progress but still has some more of this type of pain as well as gluteal pain and piriformis.  Will continue to monitor.  Discussed the potential for dry needling.  Patient does not know if she would like to have this done or not.  She will consider it and still work with physical therapy for another 4 weeks.  Then I would like patient to transition into her exercise routine and follow-up with me 4 weeks after that.  See how patient is doing and if worsening symptoms advanced imaging may be warranted.

## 2022-07-30 ENCOUNTER — Ambulatory Visit (HOSPITAL_BASED_OUTPATIENT_CLINIC_OR_DEPARTMENT_OTHER): Payer: Medicare Other | Admitting: Physical Therapy

## 2022-07-30 ENCOUNTER — Encounter (HOSPITAL_BASED_OUTPATIENT_CLINIC_OR_DEPARTMENT_OTHER): Payer: Self-pay | Admitting: Physical Therapy

## 2022-07-30 DIAGNOSIS — R262 Difficulty in walking, not elsewhere classified: Secondary | ICD-10-CM

## 2022-07-30 DIAGNOSIS — M545 Low back pain, unspecified: Secondary | ICD-10-CM

## 2022-07-30 DIAGNOSIS — M6281 Muscle weakness (generalized): Secondary | ICD-10-CM | POA: Diagnosis not present
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
## 2022-08-03 ENCOUNTER — Encounter (HOSPITAL_BASED_OUTPATIENT_CLINIC_OR_DEPARTMENT_OTHER): Payer: Self-pay

## 2022-08-03 ENCOUNTER — Ambulatory Visit (HOSPITAL_BASED_OUTPATIENT_CLINIC_OR_DEPARTMENT_OTHER): Payer: Medicare Other

## 2022-08-03 DIAGNOSIS — R262 Difficulty in walking, not elsewhere classified: Secondary | ICD-10-CM

## 2022-08-03 DIAGNOSIS — M6281 Muscle weakness (generalized): Secondary | ICD-10-CM | POA: Diagnosis not present

## 2022-08-03 DIAGNOSIS — M545 Low back pain, unspecified: Secondary | ICD-10-CM | POA: Diagnosis not present
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
## 2022-08-05 ENCOUNTER — Telehealth: Payer: Self-pay | Admitting: Medical

## 2022-08-05 ENCOUNTER — Telehealth (INDEPENDENT_AMBULATORY_CARE_PROVIDER_SITE_OTHER): Payer: Medicare Other | Admitting: Physician Assistant

## 2022-08-05 ENCOUNTER — Telehealth: Payer: Self-pay

## 2022-08-05 ENCOUNTER — Other Ambulatory Visit (HOSPITAL_BASED_OUTPATIENT_CLINIC_OR_DEPARTMENT_OTHER): Payer: Self-pay

## 2022-08-05 ENCOUNTER — Encounter: Payer: Self-pay | Admitting: Physician Assistant

## 2022-08-05 DIAGNOSIS — U071 COVID-19: Secondary | ICD-10-CM

## 2022-08-05 MED ORDER — NIRMATRELVIR/RITONAVIR (PAXLOVID) TABLET (RENAL DOSING)
2.0000 | ORAL_TABLET | Freq: Two times a day (BID) | ORAL | 0 refills | Status: AC
Start: 2022-08-05 — End: 2022-08-10
  Filled 2022-08-05: qty 20, 5d supply, fill #0

## 2022-08-05 NOTE — Telephone Encounter (Signed)
Pt states the [facility] is completely out of paxlovid. [facility] said [facility] [address] [facility]. Please advise pt and let her know where she can pick this up.   [facility] [address], [place], [place] [address]

## 2022-08-05 NOTE — Telephone Encounter (Signed)
Spoke with pt, pt states [employer] sent Rx to [facility] at [address] and pt is on her way now to pick it up.

## 2022-08-05 NOTE — Telephone Encounter (Signed)
Patient has tested positive for covid and was wanting an RX sent in to pharmacy. Virtual visit was offered and declined. Please advise

## 2022-08-05 NOTE — Progress Notes (Signed)
MyChart Video Visit    Virtual Visit via Video Note   This format is felt to be most appropriate for this patient at this time. Physical exam was limited by quality of the video and audio technology used for the visit.   Patient location: home Provider location: [facility]  I discussed the limitations of evaluation and management by telemedicine and the availability of in person appointments. The patient expressed understanding and agreed to proceed.  Patient: Karen Proctor   DOB: [birth year]   72 y.o. Female  MRN: [id number] Visit Date: 08/05/2022  Today's healthcare provider: Alfredia Ferguson, PA-C   Chief Complaint  Patient presents with   Covid Positive    08/04/22   Subjective    HPI  Pt reports testing positive for COVID 19 at home 08/04/22. Reports symptoms started Monday, x 4 days ago with headache, nasal congestion, sore throat, body aches. Denies SOB, fevers.  Would like to start on paxlovid therapy. Medications: Outpatient Medications Prior to Visit  Medication Sig   levothyroxine (SYNTHROID) 75 MCG tablet Take 1/2 tablet once weekly and 1 full tablet the other 6 days daily   liothyronine (CYTOMEL) 5 MCG tablet Take 1 tablet (5 mcg total) by mouth daily.   No facility-administered medications prior to visit.    Review of Systems  Constitutional:  Positive for fatigue. Negative for fever.  HENT:  Positive for congestion, postnasal drip and rhinorrhea.   Respiratory:  Negative for cough and shortness of breath.   Cardiovascular:  Negative for chest pain and leg swelling.  Gastrointestinal:  Negative for abdominal pain.  Neurological:  Positive for headaches. Negative for dizziness.      Objective    There were no vitals taken for this visit.    Physical Exam Constitutional:      Appearance: She is not ill-appearing.  Neurological:     Mental Status: She is oriented to person, place, and time.  Psychiatric:        Mood and Affect: Mood normal.         Behavior: Behavior normal.        Assessment & Plan     1. COVID-19 Advised given her symptoms are mild-- antihistamines, nasal sprays, tylenol/motrin, fluids and rest.   Pt would prefer starting antiviral therapy. Advised of potential SE.  Given h/o of GFR < 60, choosing renal dosing. Advised if she is going to take, to start today.  Advised masking to reduce transmission to others.  - nirmatrelvir/ritonavir, renal dosing, (PAXLOVID) 10 x 150 MG & 10 x 100MG  TABS; Take 2 tablets by mouth 2 (two) times daily for 5 days. (Take nirmatrelvir 150 mg one tablet twice daily for 5 days and ritonavir 100 mg one tablet twice daily for 5 days) Patient GFR is 57  Dispense: 20 tablet; Refill: 0   Return if symptoms worsen or fail to improve.     I discussed the assessment and treatment plan with the patient. The patient was provided an opportunity to ask questions and all were answered. The patient agreed with the plan and demonstrated an understanding of the instructions.   The patient was advised to call back or seek an in-person evaluation if the symptoms worsen or if the condition fails to improve as anticipated.  I provided  minutes of non-face-to-face time during this encounter.  I, Alfredia Ferguson, PA-C have reviewed all documentation for this visit. The documentation on  08/05/22   for the exam, diagnosis, procedures, and  orders are all accurate and complete.  Alfredia Ferguson, PA-C [facility] [phone] (phone) [phone] (fax)  [facility]

## 2022-08-05 NOTE — Telephone Encounter (Signed)
Pt called and scheduled with Lillia Abed

## 2022-08-05 NOTE — Telephone Encounter (Signed)
Pt called stating she has tested COVID positive, pt is requesting a Rx be sent in for her. Pt was advised by FO Irving Burton) a VV may be needed before Rx can be sent in and pt declined and requested a call back. Called pt and left a VM asking pt to call the office back.

## 2022-08-10 ENCOUNTER — Ambulatory Visit (HOSPITAL_BASED_OUTPATIENT_CLINIC_OR_DEPARTMENT_OTHER): Payer: Medicare Other

## 2022-08-10 ENCOUNTER — Encounter (HOSPITAL_BASED_OUTPATIENT_CLINIC_OR_DEPARTMENT_OTHER): Payer: Self-pay

## 2022-08-10 DIAGNOSIS — M545 Low back pain, unspecified: Secondary | ICD-10-CM | POA: Diagnosis not present

## 2022-08-10 DIAGNOSIS — M6281 Muscle weakness (generalized): Secondary | ICD-10-CM | POA: Diagnosis not present

## 2022-08-10 DIAGNOSIS — R262 Difficulty in walking, not elsewhere classified: Secondary | ICD-10-CM | POA: Diagnosis not present
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
## 2022-08-13 ENCOUNTER — Encounter (HOSPITAL_BASED_OUTPATIENT_CLINIC_OR_DEPARTMENT_OTHER): Payer: Medicare Other | Admitting: Physical Therapy

## 2022-08-17 ENCOUNTER — Ambulatory Visit (HOSPITAL_BASED_OUTPATIENT_CLINIC_OR_DEPARTMENT_OTHER): Payer: Medicare Other | Attending: Medical | Admitting: Physical Therapy

## 2022-08-17 ENCOUNTER — Encounter (HOSPITAL_BASED_OUTPATIENT_CLINIC_OR_DEPARTMENT_OTHER): Payer: Self-pay | Admitting: Physical Therapy

## 2022-08-17 ENCOUNTER — Encounter (HOSPITAL_BASED_OUTPATIENT_CLINIC_OR_DEPARTMENT_OTHER): Payer: Medicare Other | Admitting: Physical Therapy

## 2022-08-17 DIAGNOSIS — M6281 Muscle weakness (generalized): Secondary | ICD-10-CM | POA: Diagnosis not present

## 2022-08-17 DIAGNOSIS — M545 Low back pain, unspecified: Secondary | ICD-10-CM | POA: Insufficient documentation

## 2022-08-17 DIAGNOSIS — R262 Difficulty in walking, not elsewhere classified: Secondary | ICD-10-CM | POA: Insufficient documentation
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
## 2022-08-20 ENCOUNTER — Ambulatory Visit (HOSPITAL_BASED_OUTPATIENT_CLINIC_OR_DEPARTMENT_OTHER): Payer: Medicare Other | Admitting: Physical Therapy

## 2022-08-20 ENCOUNTER — Encounter (HOSPITAL_BASED_OUTPATIENT_CLINIC_OR_DEPARTMENT_OTHER): Payer: Self-pay | Admitting: Physical Therapy

## 2022-08-20 DIAGNOSIS — M545 Low back pain, unspecified: Secondary | ICD-10-CM | POA: Diagnosis not present

## 2022-08-20 DIAGNOSIS — R262 Difficulty in walking, not elsewhere classified: Secondary | ICD-10-CM

## 2022-08-20 DIAGNOSIS — M6281 Muscle weakness (generalized): Secondary | ICD-10-CM
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
## 2022-08-24 ENCOUNTER — Encounter (HOSPITAL_BASED_OUTPATIENT_CLINIC_OR_DEPARTMENT_OTHER): Payer: Self-pay | Admitting: Physical Therapy

## 2022-08-24 ENCOUNTER — Ambulatory Visit (HOSPITAL_BASED_OUTPATIENT_CLINIC_OR_DEPARTMENT_OTHER): Payer: Medicare Other | Admitting: Physical Therapy

## 2022-08-24 DIAGNOSIS — M545 Low back pain, unspecified: Secondary | ICD-10-CM

## 2022-08-24 DIAGNOSIS — M6281 Muscle weakness (generalized): Secondary | ICD-10-CM | POA: Diagnosis not present

## 2022-08-24 DIAGNOSIS — R262 Difficulty in walking, not elsewhere classified: Secondary | ICD-10-CM

## 2022-08-24 DIAGNOSIS — K080 Exfoliation of teeth due to systemic causes: Secondary | ICD-10-CM | POA: Diagnosis not present
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
## 2022-08-27 ENCOUNTER — Ambulatory Visit (HOSPITAL_BASED_OUTPATIENT_CLINIC_OR_DEPARTMENT_OTHER): Payer: Medicare Other | Admitting: Physical Therapy

## 2022-08-27 ENCOUNTER — Encounter (HOSPITAL_BASED_OUTPATIENT_CLINIC_OR_DEPARTMENT_OTHER): Payer: Self-pay | Admitting: Physical Therapy

## 2022-08-27 DIAGNOSIS — R262 Difficulty in walking, not elsewhere classified: Secondary | ICD-10-CM

## 2022-08-27 DIAGNOSIS — M6281 Muscle weakness (generalized): Secondary | ICD-10-CM | POA: Diagnosis not present

## 2022-08-27 DIAGNOSIS — M545 Low back pain, unspecified: Secondary | ICD-10-CM
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
## 2022-08-30 ENCOUNTER — Encounter (HOSPITAL_BASED_OUTPATIENT_CLINIC_OR_DEPARTMENT_OTHER): Payer: Self-pay | Admitting: Physical Therapy

## 2022-08-30 ENCOUNTER — Ambulatory Visit (HOSPITAL_BASED_OUTPATIENT_CLINIC_OR_DEPARTMENT_OTHER): Payer: Medicare Other | Admitting: Physical Therapy

## 2022-08-30 DIAGNOSIS — R262 Difficulty in walking, not elsewhere classified: Secondary | ICD-10-CM | POA: Diagnosis not present

## 2022-08-30 DIAGNOSIS — M545 Low back pain, unspecified: Secondary | ICD-10-CM

## 2022-08-30 DIAGNOSIS — M6281 Muscle weakness (generalized): Secondary | ICD-10-CM
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
## 2022-08-31 ENCOUNTER — Encounter (HOSPITAL_BASED_OUTPATIENT_CLINIC_OR_DEPARTMENT_OTHER): Payer: Medicare Other

## 2022-09-03 ENCOUNTER — Ambulatory Visit (HOSPITAL_BASED_OUTPATIENT_CLINIC_OR_DEPARTMENT_OTHER): Payer: Medicare Other

## 2022-09-03 ENCOUNTER — Encounter (HOSPITAL_BASED_OUTPATIENT_CLINIC_OR_DEPARTMENT_OTHER): Payer: Self-pay

## 2022-09-03 DIAGNOSIS — M6281 Muscle weakness (generalized): Secondary | ICD-10-CM | POA: Diagnosis not present

## 2022-09-03 DIAGNOSIS — M545 Low back pain, unspecified: Secondary | ICD-10-CM | POA: Diagnosis not present

## 2022-09-03 DIAGNOSIS — R262 Difficulty in walking, not elsewhere classified: Secondary | ICD-10-CM | POA: Diagnosis not present
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
## 2022-09-06 ENCOUNTER — Ambulatory Visit (HOSPITAL_BASED_OUTPATIENT_CLINIC_OR_DEPARTMENT_OTHER): Payer: Medicare Other | Admitting: Physical Therapy

## 2022-09-06 ENCOUNTER — Encounter (HOSPITAL_BASED_OUTPATIENT_CLINIC_OR_DEPARTMENT_OTHER): Payer: Self-pay | Admitting: Physical Therapy

## 2022-09-06 DIAGNOSIS — R262 Difficulty in walking, not elsewhere classified: Secondary | ICD-10-CM

## 2022-09-06 DIAGNOSIS — M545 Low back pain, unspecified: Secondary | ICD-10-CM

## 2022-09-06 DIAGNOSIS — M6281 Muscle weakness (generalized): Secondary | ICD-10-CM | POA: Diagnosis not present
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
## 2022-09-20 ENCOUNTER — Encounter: Payer: Self-pay | Admitting: Gastroenterology

## 2022-09-27 NOTE — Progress Notes (Unsigned)
[name] [address] [place] [address] Phone: [phone] Subjective:   Bruce Donath, am serving as a scribe for Dr. Antoine Primas.  I'm seeing this patient by the request  of:  [name], Kateri Mc  CC: Right hip  [id number]  07/27/2022 Making progress but still has some more of this type of pain as well as gluteal pain and piriformis.  Will continue to monitor.  Discussed the potential for dry needling.  Patient does not know if she would like to have this done or not.  She will consider it and still work with physical therapy for another 4 weeks.  Then I would like patient to transition into her exercise routine and follow-up with me 4 weeks after that.  See how patient is doing and if worsening symptoms advanced imaging may be warranted.      Update 09/28/2022 Oliveah Wolfgram is a 72 y.o. female coming in with complaint of R hip pain. Patient states that her pain intensity and duration have improved. Pain is worse at night. She has dull pain during the day. Continues to feel like her R side is weaker than her left side.        Past Medical History:  Diagnosis Date   Adrenal insufficiency (HCC)    Fibromyalgia    Hypothyroidism    Inflammatory arthritis 11/15/2013   Lead toxicity    Metabolic syndrome    Postmenopausal    Vitamin D deficiency    Past Surgical History:  Procedure Laterality Date   APPENDECTOMY  1981   fibroid tumor     MYOMECTOMY  1993   OTHER SURGICAL HISTORY  1990   surgery for endometriosis and removal of fibroid tumor.   TOE SURGERY     TONSILLECTOMY  2009   Social History   Socioeconomic History   Marital status: Divorced    Spouse name: Not on file   Number of children: Not on file   Years of education: Not on file   Highest education level: Master's degree (e.g., MA, MS, MEng, MEd, MSW, MBA)  Occupational History   Occupation: Retired  Tobacco Use   Smoking status: Never   Smokeless  tobacco: Never  Vaping Use   Vaping status: Never Used  Substance and Sexual Activity   Alcohol use: Yes    Alcohol/week: 0.0 standard drinks of alcohol    Comment: very occasionally   Drug use: No   Sexual activity: Not on file  Other Topics Concern   Not on file  Social History Narrative   Not on file   Social Determinants of Health   Financial Resource Strain: Low Risk  (05/16/2022)   Overall Financial Resource Strain (CARDIA)    Difficulty of Paying Living Expenses: Not very hard  Food Insecurity: No Food Insecurity (05/16/2022)   Hunger Vital Sign    Worried About Running Out of Food in the Last Year: Never true    Ran Out of Food in the Last Year: Never true  Transportation Needs: No Transportation Needs (05/16/2022)   PRAPARE - Administrator, Civil Service (Medical): No    Lack of Transportation (Non-Medical): No  Physical Activity: Insufficiently Active (05/16/2022)   Exercise Vital Sign    Days of Exercise per Week: 5 days    Minutes of Exercise per Session: 20 min  Stress: No Stress Concern Present (05/16/2022)   [employer] of Occupational Health - Occupational Stress Questionnaire  Feeling of Stress : Only a little  Social Connections: Moderately Integrated (05/16/2022)   Social Connection and Isolation Panel [NHANES]    Frequency of Communication with Friends and Family: More than three times a week    Frequency of Social Gatherings with Friends and Family: More than three times a week    Attends Religious Services: More than 4 times per year    Active Member of [employer] or Organizations: Yes    Attends Engineer, structural: More than 4 times per year    Marital Status: Divorced   Allergies  Allergen Reactions   Gluten Meal    Family History  Problem Relation Age of Onset   COPD Mother 72   Skin cancer Father    Colon cancer Neg Hx    Esophageal cancer Neg Hx    Rectal cancer Neg Hx    Stomach cancer Neg Hx    Thyroid disease Neg Hx      Current Outpatient Medications (Endocrine & Metabolic):    levothyroxine (SYNTHROID) 75 MCG tablet, Take 1/2 tablet once weekly and 1 full tablet the other 6 days daily   liothyronine (CYTOMEL) 5 MCG tablet, Take 1 tablet (5 mcg total) by mouth daily.        Reviewed prior external information including notes and imaging from  primary care provider As well as notes that were available from care everywhere and other healthcare systems.  Past medical history, social, surgical and family history all reviewed in electronic medical record.  No pertanent information unless stated regarding to the chief complaint.   Review of Systems:  No headache, visual changes, nausea, vomiting, diarrhea, constipation, dizziness, abdominal pain, skin rash, fevers, chills, night sweats, weight loss, swollen lymph nodes, body aches, joint swelling, chest pain, shortness of breath, mood changes. POSITIVE muscle aches  Objective  Blood pressure 122/84, pulse 86, height 4\' 11"  (1.499 m), weight 157 lb (71.2 kg), SpO2 98%.   General: No apparent distress alert and oriented x3 mood and affect normal, dressed appropriately.  HEENT: Pupils equal, extraocular movements intact  Respiratory: Patient's speak in full sentences and does not appear short of breath  Cardiovascular: No lower extremity edema, non tender, no erythema  Patient does still have some tightness noted in the piriformis area.  Positive Faber on the right side.  Negative straight leg test noted at the moment.  Some loss of lordosis of the lumbar spine.    Impression and Recommendations:     The above documentation has been reviewed and is accurate and complete Judi Saa, DO

## 2022-09-28 ENCOUNTER — Ambulatory Visit: Payer: Medicare Other | Admitting: Family Medicine

## 2022-09-28 ENCOUNTER — Encounter: Payer: Self-pay | Admitting: Family Medicine

## 2022-09-28 VITALS — BP 122/84 | HR 86 | Ht 59.0 in | Wt 157.0 lb

## 2022-09-28 DIAGNOSIS — M7061 Trochanteric bursitis, right hip: Secondary | ICD-10-CM

## 2022-09-28 MED ORDER — KETOROLAC TROMETHAMINE 30 MG/ML IJ SOLN
30.0000 mg | Freq: Once | INTRAMUSCULAR | Status: AC
Start: 2022-09-28 — End: 2022-09-28
  Administered 2022-09-28: 30 mg via INTRAMUSCULAR

## 2022-09-28 MED ORDER — METHYLPREDNISOLONE ACETATE 40 MG/ML IJ SUSP
40.0000 mg | Freq: Once | INTRAMUSCULAR | Status: AC
Start: 2022-09-28 — End: 2022-09-28
  Administered 2022-09-28: 40 mg via INTRAMUSCULAR

## 2022-09-28 NOTE — Assessment & Plan Note (Addendum)
Being as active as she normally is.  We discussed with patient to continue home exercises when she can.  Still noticing some tenderness on the right side.  Patient denied any type of advanced imaging at this moment.  Feels like if she keeps working on it she will do well.  Would like to follow-up with me on an as-needed basis but knows that we are here if she needs anything else.  Patient is in agreement with the plan and will check in with [place] if anything else is necessary.  Toradol and Depo-Medrol given today.

## 2022-09-28 NOTE — Patient Instructions (Signed)
Sorry for your loss Injections in backside Get back on routine You know where we are if you need [place]

## 2022-11-08 ENCOUNTER — Ambulatory Visit (AMBULATORY_SURGERY_CENTER): Payer: Medicare Other

## 2022-11-08 VITALS — Ht 59.0 in | Wt 155.0 lb

## 2022-11-08 DIAGNOSIS — Z860100 Personal history of colon polyps, unspecified: Secondary | ICD-10-CM

## 2022-11-08 NOTE — Progress Notes (Signed)

## 2022-11-15 DIAGNOSIS — L738 Other specified follicular disorders: Secondary | ICD-10-CM | POA: Diagnosis not present

## 2022-11-15 DIAGNOSIS — L820 Inflamed seborrheic keratosis: Secondary | ICD-10-CM | POA: Diagnosis not present

## 2022-11-15 DIAGNOSIS — D2262 Melanocytic nevi of left upper limb, including shoulder: Secondary | ICD-10-CM | POA: Diagnosis not present

## 2022-11-15 DIAGNOSIS — L821 Other seborrheic keratosis: Secondary | ICD-10-CM | POA: Diagnosis not present

## 2022-11-15 DIAGNOSIS — D2261 Melanocytic nevi of right upper limb, including shoulder: Secondary | ICD-10-CM | POA: Diagnosis not present

## 2022-11-15 DIAGNOSIS — D225 Melanocytic nevi of trunk: Secondary | ICD-10-CM | POA: Diagnosis not present

## 2022-11-15 DIAGNOSIS — D2272 Melanocytic nevi of left lower limb, including hip: Secondary | ICD-10-CM | POA: Diagnosis not present

## 2022-11-15 DIAGNOSIS — D485 Neoplasm of uncertain behavior of skin: Secondary | ICD-10-CM | POA: Diagnosis not present

## 2022-11-16 ENCOUNTER — Encounter: Payer: Self-pay | Admitting: Gastroenterology

## 2022-11-25 ENCOUNTER — Encounter: Payer: Self-pay | Admitting: Gastroenterology

## 2022-11-25 ENCOUNTER — Ambulatory Visit: Payer: Medicare Other | Admitting: Gastroenterology

## 2022-11-25 VITALS — BP 108/54 | HR 74 | Temp 98.9°F | Resp 13 | Ht 59.0 in | Wt 155.0 lb

## 2022-11-25 DIAGNOSIS — D122 Benign neoplasm of ascending colon: Secondary | ICD-10-CM

## 2022-11-25 DIAGNOSIS — Z09 Encounter for follow-up examination after completed treatment for conditions other than malignant neoplasm: Secondary | ICD-10-CM | POA: Diagnosis not present

## 2022-11-25 DIAGNOSIS — Z860100 Personal history of colon polyps, unspecified: Secondary | ICD-10-CM | POA: Diagnosis not present

## 2022-11-25 DIAGNOSIS — D125 Benign neoplasm of sigmoid colon: Secondary | ICD-10-CM | POA: Diagnosis not present

## 2022-11-25 MED ORDER — SODIUM CHLORIDE 0.9 % IV SOLN
500.0000 mL | Freq: Once | INTRAVENOUS | Status: AC
Start: 2022-11-25 — End: ?

## 2022-11-25 NOTE — Progress Notes (Signed)
Called to room to assist during endoscopic procedure.  Patient ID and intended procedure confirmed with present staff. Received instructions for my participation in the procedure from the performing physician.  

## 2022-11-25 NOTE — Patient Instructions (Signed)
YOU HAD AN ENDOSCOPIC PROCEDURE TODAY AT THE [facility]:   Refer to the procedure report that was given to you for any specific questions about what was found during the examination.  If the procedure report does not answer your questions, please call your gastroenterologist to clarify.  If you requested that your care partner not be given the details of your procedure findings, then the procedure report has been included in a sealed envelope for you to review at your convenience later.  YOU SHOULD EXPECT: Some feelings of bloating in the abdomen. Passage of more gas than usual.  Walking can help get rid of the air that was put into your GI tract during the procedure and reduce the bloating. If you had a lower endoscopy (such as a colonoscopy or flexible sigmoidoscopy) you may notice spotting of blood in your stool or on the toilet paper. If you underwent a bowel prep for your procedure, you may not have a normal bowel movement for a few days.  Please Note:  You might notice some irritation and congestion in your nose or some drainage.  This is from the oxygen used during your procedure.  There is no need for concern and it should clear up in a day or so.  SYMPTOMS TO REPORT IMMEDIATELY:  Following lower endoscopy (colonoscopy or flexible sigmoidoscopy):  Excessive amounts of blood in the stool  Significant tenderness or worsening of abdominal pains  Swelling of the abdomen that is new, acute  Fever of 100F or higher   For urgent or emergent issues, a gastroenterologist can be reached at any hour by calling [phone]. Do not use MyChart messaging for urgent concerns.    DIET:  We do recommend a small meal at first, but then you may proceed to your regular diet.  Drink plenty of fluids but you should avoid alcoholic beverages for 24 hours.  MEDICATIONS: Continue present medications.  FOLLOW UP: Await pathology results. Repeat colonoscopy in 3-5 years for surveillance based  on pathology results.  Please see handouts given to you by your recovery nurse: Polyps. Diverticulosis, Hemorrhoids.  Thank you for allowing [place] to provide for your healthcare needs today.  ACTIVITY:  You should plan to take it easy for the rest of today and you should NOT DRIVE or use heavy machinery until tomorrow (because of the sedation medicines used during the test).    FOLLOW UP: Our staff will call the number listed on your records the next business day following your procedure.  We will call around 7:15- 8:00 am to check on you and address any questions or concerns that you may have regarding the information given to you following your procedure. If we do not reach you, we will leave a message.     If any biopsies were taken you will be contacted by phone or by letter within the next 1-3 weeks.  Please call us at [phone] if you have not heard about the biopsies in 3 weeks.    SIGNATURES/CONFIDENTIALITY: You and/or your care partner have signed paperwork which will be entered into your electronic medical record.  These signatures attest to the fact that that the information above on your After Visit Summary has been reviewed and is understood.  Full responsibility of the confidentiality of this discharge information lies with you and/or your care-partner.

## 2022-11-25 NOTE — Progress Notes (Signed)
Sedate, gd SR, tolerated procedure well, VSS, report to RN 

## 2022-11-25 NOTE — Progress Notes (Signed)
[facility] History and Physical   Primary Care Physician:  Esperanza Richters, PA-C   Reason for Procedure:  History of adenomatous colon polyps  Plan:    Surveillance colonoscopy with possible interventions as needed     HPI: Karen Proctor is a very pleasant 73 y.o. female here for surveillance colonoscopy. Denies any nausea, vomiting, abdominal pain, melena or bright red blood per rectum  The risks and benefits as well as alternatives of endoscopic procedure(s) have been discussed and reviewed. All questions answered. The patient agrees to proceed.    Past Medical History:  Diagnosis Date   Adrenal insufficiency (HCC)    Fibromyalgia    Hypothyroidism    Inflammatory arthritis 11/15/2013   Lead toxicity    Metabolic syndrome    Postmenopausal    Vitamin D deficiency     Past Surgical History:  Procedure Laterality Date   APPENDECTOMY  1981   fibroid tumor     MYOMECTOMY  1993   OTHER SURGICAL HISTORY  1990   surgery for endometriosis and removal of fibroid tumor.   TOE SURGERY     TONSILLECTOMY  2009    Prior to Admission medications   Medication Sig Start Date End Date Taking? Authorizing Provider  levothyroxine (SYNTHROID) 75 MCG tablet Take 1/2 tablet once weekly and 1 full tablet the other 6 days daily Patient taking differently: Take 1/2 tablet once daily 6 days a week 06/02/22  Yes Reather Littler, MD  liothyronine (CYTOMEL) 5 MCG tablet Take 1 tablet (5 mcg total) by mouth daily. Patient taking differently: Take 5 mcg by mouth daily. 1 tablet 6 days a week 06/02/22  Yes Reather Littler, MD    Current Outpatient Medications  Medication Sig Dispense Refill   levothyroxine (SYNTHROID) 75 MCG tablet Take 1/2 tablet once weekly and 1 full tablet the other 6 days daily (Patient taking differently: Take 1/2 tablet once daily 6 days a week) 90 tablet 3   liothyronine (CYTOMEL) 5 MCG tablet Take 1 tablet (5 mcg total) by mouth daily. (Patient taking  differently: Take 5 mcg by mouth daily. 1 tablet 6 days a week) 90 tablet 1   Current Facility-Administered Medications  Medication Dose Route Frequency Provider Last Rate Last Admin   0.9 %  sodium chloride infusion  500 mL Intravenous Once Napoleon Form, MD        Allergies as of 11/25/2022 - Review Complete 11/25/2022  Allergen Reaction Noted   Gluten meal  12/12/2017    Family History  Problem Relation Age of Onset   COPD Mother 32   Skin cancer Father    Colon cancer Neg Hx    Esophageal cancer Neg Hx    Rectal cancer Neg Hx    Stomach cancer Neg Hx    Thyroid disease Neg Hx     Social History   Socioeconomic History   Marital status: Divorced    Spouse name: Not on file   Number of children: Not on file   Years of education: Not on file   Highest education level: Master's degree (e.g., MA, MS, MEng, MEd, MSW, MBA)  Occupational History   Occupation: Retired  Tobacco Use   Smoking status: Never   Smokeless tobacco: Never  Vaping Use   Vaping status: Never Used  Substance and Sexual Activity   Alcohol use: Yes    Alcohol/week: 0.0 standard drinks of alcohol    Comment: very occasionally   Drug use: No   Sexual activity: Not  on file  Other Topics Concern   Not on file  Social History Narrative   Not on file   Social Determinants of Health   Financial Resource Strain: Low Risk  (05/16/2022)   Overall Financial Resource Strain (CARDIA)    Difficulty of Paying Living Expenses: Not very hard  Food Insecurity: No Food Insecurity (05/16/2022)   Hunger Vital Sign    Worried About Running Out of Food in the Last Year: Never true    Ran Out of Food in the Last Year: Never true  Transportation Needs: No Transportation Needs (05/16/2022)   PRAPARE - Administrator, Civil Service (Medical): No    Lack of Transportation (Non-Medical): No  Physical Activity: Insufficiently Active (05/16/2022)   Exercise Vital Sign    Days of Exercise per Week: 5 days     Minutes of Exercise per Session: 20 min  Stress: No Stress Concern Present (05/16/2022)   [employer] of Occupational Health - Occupational Stress Questionnaire    Feeling of Stress : Only a little  Social Connections: Moderately Integrated (05/16/2022)   Social Connection and Isolation Panel [NHANES]    Frequency of Communication with Friends and Family: More than three times a week    Frequency of Social Gatherings with Friends and Family: More than three times a week    Attends Religious Services: More than 4 times per year    Active Member of [employer] or Organizations: Yes    Attends Engineer, structural: More than 4 times per year    Marital Status: Divorced  Intimate Partner Violence: Not At Risk (09/07/2021)   Humiliation, Afraid, Rape, and Kick questionnaire    Fear of Current or Ex-Partner: No    Emotionally Abused: No    Physically Abused: No    Sexually Abused: No    Review of Systems:  All other review of systems negative except as mentioned in the HPI.  Physical Exam: Vital signs in last 24 hours: BP 126/83   Pulse 88   Temp 98.9 F (37.2 C)   Ht 4\' 11"  (1.499 m)   Wt 155 lb (70.3 kg)   SpO2 99%   BMI 31.31 kg/m  General:   Alert, NAD Lungs:  Clear .   Heart:  Regular rate and rhythm Abdomen:  Soft, nontender and nondistended. Neuro/Psych:  Alert and cooperative. Normal mood and affect. A and O x 3  Reviewed labs, radiology imaging, old records and pertinent past GI work up  Patient is appropriate for planned procedure(s) and anesthesia in an ambulatory setting   K. Scherry Ran , MD [phone]

## 2022-11-25 NOTE — Op Note (Signed)
[facility] Patient Name: Karen Proctor Procedure Date: 11/25/2022 11:46 AM MRN: [id number] Endoscopist: Napoleon Form , MD, [id number] Age: 72 Referring MD:  Date of Birth: 11-Apr-1950 Gender: Female Account #: [id number] Procedure:                Colonoscopy Indications:              High risk colon cancer surveillance: Personal                            history of colonic polyps, High risk colon cancer                            surveillance: Personal history of adenoma less than                            10 mm in size Medicines:                Monitored Anesthesia Care Procedure:                Pre-Anesthesia Assessment:                           - Prior to the procedure, a History and Physical                            was performed, and patient medications and                            allergies were reviewed. The patient's tolerance of                            previous anesthesia was also reviewed. The risks                            and benefits of the procedure and the sedation                            options and risks were discussed with the patient.                            All questions were answered, and informed consent                            was obtained. Prior Anticoagulants: The patient has                            taken no anticoagulant or antiplatelet agents. ASA                            Grade Assessment: II - A patient with mild systemic                            disease. After reviewing the risks and benefits,  the patient was deemed in satisfactory condition to                            undergo the procedure.                           After obtaining informed consent, the colonoscope                            was passed under direct vision. Throughout the                            procedure, the patient's blood pressure, pulse, and                            oxygen saturations were monitored  continuously. The                            [id number]-HQ190L Colonoscope [id number] was introduced                            through the anus and advanced to the the cecum,                            identified by appendiceal orifice and ileocecal                            valve. The colonoscopy was performed without                            difficulty. The patient tolerated the procedure                            well. The quality of the bowel preparation was                            good. The ileocecal valve, appendiceal orifice, and                            rectum were photographed. Scope In: 11:49:22 AM Scope Out: 12:08:13 PM Scope Withdrawal Time: 0 hours 12 minutes 13 seconds  Total Procedure Duration: 0 hours 18 minutes 51 seconds  Findings:                 The perianal and digital rectal examinations were                            normal.                           Two sessile polyps were found in the sigmoid colon                            and ascending colon. The polyps were 6 to 10 mm in  size. These polyps were removed with a cold snare.                            Resection and retrieval were complete.                           Scattered large-mouthed, medium-mouthed and                            small-mouthed diverticula were found in the sigmoid                            colon, descending colon, transverse colon and                            ascending colon.                           Non-bleeding external and internal hemorrhoids were                            found during retroflexion. The hemorrhoids were                            small. Complications:            No immediate complications. Estimated Blood Loss:     Estimated blood loss was minimal. Impression:               - Two 6 to 10 mm polyps in the sigmoid colon and in                            the ascending colon, removed with a cold snare.                            Resected and  retrieved.                           - Diverticulosis in the sigmoid colon, in the                            descending colon, in the transverse colon and in                            the ascending colon.                           - Non-bleeding external and internal hemorrhoids. Recommendation:           - Patient has a contact number available for                            emergencies. The signs and symptoms of potential                            delayed complications were discussed with the  patient. Return to normal activities tomorrow.                            Written discharge instructions were provided to the                            patient.                           - Resume previous diet.                           - Continue present medications.                           - Await pathology results.                           - Repeat colonoscopy in 3 - 5 years for                            surveillance based on pathology results. Napoleon Form, MD 11/25/2022 12:13:24 PM This report has been signed electronically.

## 2022-11-26 ENCOUNTER — Telehealth: Payer: Self-pay

## 2022-11-26 NOTE — Telephone Encounter (Signed)
  Follow up Call-     11/25/2022   10:40 AM  Call back number  Post procedure Call Back phone  # [phone]  Permission to leave phone message Yes     Patient questions:  Do you have a fever, pain , or abdominal swelling? No. Pain Score  0 *  Have you tolerated food without any problems? Yes.    Have you been able to return to your normal activities? Yes.    Do you have any questions about your discharge instructions: Diet   No. Medications  No. Follow up visit  No.  Do you have questions or concerns about your Care? No.  Actions: * If pain score is 4 or above: No action needed, pain <4.

## 2022-11-29 LAB — SURGICAL PATHOLOGY

## 2022-12-02 DIAGNOSIS — Z1331 Encounter for screening for depression: Secondary | ICD-10-CM | POA: Diagnosis not present

## 2022-12-02 DIAGNOSIS — Z124 Encounter for screening for malignant neoplasm of cervix: Secondary | ICD-10-CM | POA: Diagnosis not present

## 2022-12-17 ENCOUNTER — Encounter: Payer: Self-pay | Admitting: Gastroenterology

## 2023-01-17 ENCOUNTER — Telehealth: Payer: Self-pay | Admitting: Endocrinology

## 2023-01-17 ENCOUNTER — Other Ambulatory Visit: Payer: Self-pay

## 2023-01-17 ENCOUNTER — Telehealth: Payer: Self-pay | Admitting: Medical

## 2023-01-17 DIAGNOSIS — E039 Hypothyroidism, unspecified: Secondary | ICD-10-CM

## 2023-01-17 MED ORDER — LEVOTHYROXINE SODIUM 75 MCG PO TABS
ORAL_TABLET | ORAL | 3 refills | Status: DC
Start: 2023-01-17 — End: 2023-01-24

## 2023-01-17 MED ORDER — LIOTHYRONINE SODIUM 5 MCG PO TABS
5.0000 ug | ORAL_TABLET | Freq: Every day | ORAL | 1 refills | Status: DC
Start: 2023-01-17 — End: 2023-01-24

## 2023-01-17 NOTE — Telephone Encounter (Signed)
 Copied from CRM [id number]. Topic: Medicare AWV >> Jan 17, 2023  1:27 PM Nathanel DEL wrote: Reason for CRM: Called LVM 01/17/2023 to schedule AWV. Please schedule Virtual or Telehealth visits ONLY  Nathanel Paschal; Care Guide Ambulatory Clinical Support [facility] Direct Dial: [phone]

## 2023-01-17 NOTE — Telephone Encounter (Signed)
 MEDICATION:  1)  levothyroxine  levothyroxine  (SYNTHROID ) 75 MCG tablet  2)  liothyronine  liothyronine  (CYTOMEL ) 5 MCG tablet  PHARMACY:  [facility] [place], [place] - [address] (Ph: [phone])   HAS THE PATIENT CONTACTED THEIR PHARMACY?  Yes  IS THIS A 90 DAY SUPPLY : Yes  IS PATIENT OUT OF MEDICATION: Yes  IF NOT; HOW MUCH IS LEFT:   LAST APPOINTMENT DATE: @ 06/02/2022  NEXT APPOINTMENT DATE:@1 /13/2025  DO WE HAVE YOUR PERMISSION TO LEAVE A DETAILED MESSAGE?:Yes.  OTHER COMMENTS: Patient just picked up last refill of Levothyroxine , ut is out of Liothyronine  and she takes those together.   **Let patient know to contact pharmacy at the end of the day to make sure medication is ready. **  ** Please notify patient to allow 48-72 hours to process**  **Encourage patient to contact the pharmacy for refills or they can request refills through [facility]**

## 2023-01-18 ENCOUNTER — Other Ambulatory Visit: Payer: Medicare Other

## 2023-01-18 DIAGNOSIS — E039 Hypothyroidism, unspecified: Secondary | ICD-10-CM | POA: Diagnosis not present

## 2023-01-19 LAB — TSH: TSH: 2.93 m[IU]/L (ref 0.40–4.50)

## 2023-01-19 LAB — T4, FREE: Free T4: 1 ng/dL (ref 0.8–1.8)

## 2023-01-19 LAB — T3, FREE: T3, Free: 2.9 pg/mL (ref 2.3–4.2)

## 2023-01-24 ENCOUNTER — Ambulatory Visit: Payer: Medicare Other | Admitting: Endocrinology

## 2023-01-24 ENCOUNTER — Encounter: Payer: Self-pay | Admitting: Endocrinology

## 2023-01-24 VITALS — BP 130/82 | HR 87 | Ht 59.0 in | Wt 159.4 lb

## 2023-01-24 DIAGNOSIS — E039 Hypothyroidism, unspecified: Secondary | ICD-10-CM | POA: Diagnosis not present

## 2023-01-24 MED ORDER — LEVOTHYROXINE SODIUM 75 MCG PO TABS: ORAL_TABLET | ORAL | 3 refills | Status: DC

## 2023-01-24 MED ORDER — LIOTHYRONINE SODIUM 5 MCG PO TABS: ORAL_TABLET | ORAL | 3 refills | Status: DC

## 2023-01-24 NOTE — Progress Notes (Signed)
 Outpatient Endocrinology Note Elton Heid, MD   Patient's Name: Karen Proctor    DOB: 1950/07/26    MRN: [id number]  REASON OF VISIT: Follow-up for hypothyroidism  PCP: Dorina Loving, PA-C  HISTORY OF PRESENT ILLNESS:   Karen Proctor is a 73 y.o. old female with past medical history as listed below is presented for a follow up for hypothyroidism.   Pertinent Thyroid  History: Patient was previously seen by Dr. Von and was last time seen in May 2024.  Patient was diagnosed with hypothyroidism in 2004.  Patient had symptoms of significant fatigue at the time of diagnosis.She was seen by a holistic physician who told her that she was hypothyroid, not clear if she had a baseline thyroid  enlargement or abnormal antibody studies.  She was treated with Armour Thyroid  initially with improvement in her thyroid  symptoms. Subsequently she was also tried by another physician on levothyroxine  but she felt more tired with this. Since about 2011 or so she had been taking a combination of levothyroxine  and Cytomel .   The patient is partly followed in the [place] where she lived most of the time by a local endocrinologist Previous TSH levels done in the [place] since 2018 showed that TSH level had been low normal or low ranging between 0.11 and 0.54. Apparently in 2/21 in [place] she was having symptoms of chest pain and cardiac evaluation was negative.  She was told that her symptoms were related to side effects of Euthyrox  Review of her labs indicates that her TSH had been low in 07/01/2018 and 08/31/2018 but normal in 7/20.  In 2021 she was switched to Armour Thyroid  equivalent capsules in the [place]. On her follow-up visit in 9/21  she was having some feeling of weakness, chest discomfort similar to when she had over replacement with thyroid  hormones previously. Since her TSH was low at 0.11 without any increase in her T4/T3 levels she was switched to a combination of  levothyroxine  and Cytomel .  She is on 75 mcg of levothyroxine  and 5 mcg of liothyronine , both 6 days a week.  Not taking both on Sundays.  Apparently she starts getting palpitations and chest pain when her thyroid  levels are somewhat high.  Other: OSTEOPENIA with T-score -2.3: Managed by PCP, she does not want to take any pharmacological treatment despite discussion about potential fracture reduction previously.   Interval history Patient has been taking levothyroxine  75 mcg daily for 6 days and liothyronine  5 mcg daily for 6 days, not taking on Sundays.  She denies palpitation or heat intolerance.  She is concerned about slow and gradual weight gain.  She has plan to start exercise and diet plan through flex plan from her medical insurance.  She states she feels probably better on Armour Thyroid .  Discussed thyroid  medication Armour Thyroid  versus combination therapy she is currently taking, I recommended she stay on combination treatment and she agreed.  Recent labs reviewed as follows with normal thyroid  function test.   Latest Reference Range & Units 01/18/23 09:49  TSH 0.40 - 4.50 mIU/L 2.93  Triiodothyronine,Free,Serum 2.3 - 4.2 pg/mL 2.9  T4,Free(Direct) 0.8 - 1.8 ng/dL 1.0    REVIEW OF SYSTEMS:  As per history of present illness.   PAST MEDICAL HISTORY: Past Medical History:  Diagnosis Date   Adrenal insufficiency (HCC)    Fibromyalgia    Hypothyroidism    Inflammatory arthritis 11/15/2013   Lead toxicity    Metabolic syndrome    Postmenopausal    Vitamin  D deficiency     PAST SURGICAL HISTORY: Past Surgical History:  Procedure Laterality Date   APPENDECTOMY  1981   fibroid tumor     MYOMECTOMY  1993   OTHER SURGICAL HISTORY  1990   surgery for endometriosis and removal of fibroid tumor.   TOE SURGERY     TONSILLECTOMY  2009    ALLERGIES: Allergies  Allergen Reactions   Gluten Meal     Not an issue currently per patient 11/08/22    FAMILY HISTORY:   Family History  Problem Relation Age of Onset   COPD Mother 61   Skin cancer Father    Colon cancer Neg Hx    Esophageal cancer Neg Hx    Rectal cancer Neg Hx    Stomach cancer Neg Hx    Thyroid  disease Neg Hx     SOCIAL HISTORY: Social History   Socioeconomic History   Marital status: Divorced    Spouse name: Not on file   Number of children: Not on file   Years of education: Not on file   Highest education level: Master's degree (e.g., MA, MS, MEng, MEd, MSW, MBA)  Occupational History   Occupation: Retired  Tobacco Use   Smoking status: Never   Smokeless tobacco: Never  Vaping Use   Vaping status: Never Used  Substance and Sexual Activity   Alcohol use: Yes    Alcohol/week: 0.0 standard drinks of alcohol    Comment: very occasionally   Drug use: No   Sexual activity: Not on file  Other Topics Concern   Not on file  Social History Narrative   Not on file   Social Drivers of Health   Financial Resource Strain: Low Risk  (05/16/2022)   Overall Financial Resource Strain (CARDIA)    Difficulty of Paying Living Expenses: Not very hard  Food Insecurity: No Food Insecurity (05/16/2022)   Hunger Vital Sign    Worried About Running Out of Food in the Last Year: Never true    Ran Out of Food in the Last Year: Never true  Transportation Needs: No Transportation Needs (05/16/2022)   PRAPARE - Administrator, Civil Service (Medical): No    Lack of Transportation (Non-Medical): No  Physical Activity: Insufficiently Active (05/16/2022)   Exercise Vital Sign    Days of Exercise per Week: 5 days    Minutes of Exercise per Session: 20 min  Stress: No Stress Concern Present (05/16/2022)   [employer] of Occupational Health - Occupational Stress Questionnaire    Feeling of Stress : Only a little  Social Connections: Moderately Integrated (05/16/2022)   Social Connection and Isolation Panel [NHANES]    Frequency of Communication with Friends and Family: More than three  times a week    Frequency of Social Gatherings with Friends and Family: More than three times a week    Attends Religious Services: More than 4 times per year    Active Member of [employer] or Organizations: Yes    Attends Engineer, Structural: More than 4 times per year    Marital Status: Divorced    MEDICATIONS:  Current Outpatient Medications  Medication Sig Dispense Refill   levothyroxine  (SYNTHROID ) 75 MCG tablet Take 1 tablet once daily 6 days a week, skip in Sundays 78 tablet 3   liothyronine  (CYTOMEL ) 5 MCG tablet 1 tablet 6 days a week, skip on Sundays 78 tablet 3   Current Facility-Administered Medications  Medication Dose Route Frequency Provider Last Rate Last  Admin   0.9 %  sodium chloride  infusion  500 mL Intravenous Once Nandigam, Kavitha V, MD        PHYSICAL EXAM: Vitals:   01/24/23 0951  BP: 130/82  Pulse: 87  SpO2: 97%  Weight: 159 lb 6.4 oz (72.3 kg)  Height: 4' 11 (1.499 m)   Body mass index is 32.19 kg/m.  Wt Readings from Last 3 Encounters:  01/24/23 159 lb 6.4 oz (72.3 kg)  11/25/22 155 lb (70.3 kg)  11/08/22 155 lb (70.3 kg)    General: Well developed, well nourished female in no apparent distress.  HEENT: AT/[place], no external lesions. Hearing intact to the spoken word Eyes: Conjunctiva clear and no icterus. Neck: Trachea midline, neck supple without appreciable thyromegaly or lymphadenopathy and no palpable thyroid  nodules Lungs: Clear to auscultation, no wheeze. Respirations not labored Heart: S1S2, Regular in rate and rhythm.  Abdomen: Soft, non tender, non distended Neurologic: Alert, oriented, normal speech, deep tendon biceps reflexes 2-3+,  no gross focal neurological deficit Extremities:+ tremors of outstretched hands.  Patient reports she has central tremor. Skin: Warm, color good.  Psychiatric: Does not appear depressed or anxious  PERTINENT HISTORIC LABORATORY AND IMAGING STUDIES:  All pertinent laboratory results were reviewed.  Please see HPI also for further details.   TSH  Date Value Ref Range Status  01/18/2023 2.93 0.40 - 4.50 mIU/L Final  05/28/2022 1.31 0.35 - 5.50 uIU/mL Final  11/06/2021 1.91 0.35 - 5.50 uIU/mL Final     ASSESSMENT / PLAN  1. Adult hypothyroidism    -Patient has hypothyroidism since 2004.  She was on Armour Thyroid  or levothyroxine  at different time in the past.  Lately she has been on combination therapy with levothyroxine  and liothyronine .  She used to partly follow-up with endocrinology in [place] in the past.  Plan: -Continue current dose of levothyroxine  75 mcg daily for 6 days a week and liothyronine  5 mcg daily for 6 days a week, skipping both on Sundays. -Recent thyroid  function test normal. -Discussed follow-up with endocrinology 59-month versus 1 year.  Patient preferred to follow-up annually.  Encouraged to call our clinic with any question in between the visits. -Endocrinology follow-up annually.   Selenia Mihok Jolinda Schooner was seen today for hypothyroidism.  Diagnoses and all orders for this visit:  Adult hypothyroidism -     T4, free -     T3, free -     TSH -     levothyroxine  (SYNTHROID ) 75 MCG tablet; Take 1 tablet once daily 6 days a week, skip in Sundays -     liothyronine  (CYTOMEL ) 5 MCG tablet; 1 tablet 6 days a week, skip on Sundays   DISPOSITION Follow up in clinic in 12 months suggested.  Labs prior to follow-up visit.  All questions answered and patient verbalized understanding of the plan.  Cainen Burnham, MD [facility] End[facility]inology [facility] [address] [place], [address] [place], [place] [address] Phone # [phone]  At least part of this note was generated using voice recognition software. Inadvertent word errors may have occurred, which were not recognized during the proofreading process.

## 2023-02-11 DIAGNOSIS — Z1231 Encounter for screening mammogram for malignant neoplasm of breast: Secondary | ICD-10-CM | POA: Diagnosis not present

## 2023-02-11 LAB — HM MAMMOGRAPHY

## 2023-02-14 ENCOUNTER — Encounter: Payer: Self-pay | Admitting: Medical

## 2023-02-16 ENCOUNTER — Encounter: Payer: Self-pay | Admitting: Medical

## 2023-02-16 DIAGNOSIS — R922 Inconclusive mammogram: Secondary | ICD-10-CM | POA: Diagnosis not present

## 2023-02-16 DIAGNOSIS — N6311 Unspecified lump in the right breast, upper outer quadrant: Secondary | ICD-10-CM | POA: Diagnosis not present

## 2023-05-01 IMAGING — CR DG TIBIA/FIBULA 2V*L*
4 series · 4 of 4 positions shown · non-contrast
Comparison: 04/21/2021

CLINICAL DATA: Acute left lower leg injury, pain

EXAM:
LEFT TIBIA AND FIBULA - 2 VIEW

[t tib/fib lat left (1 of 2)]
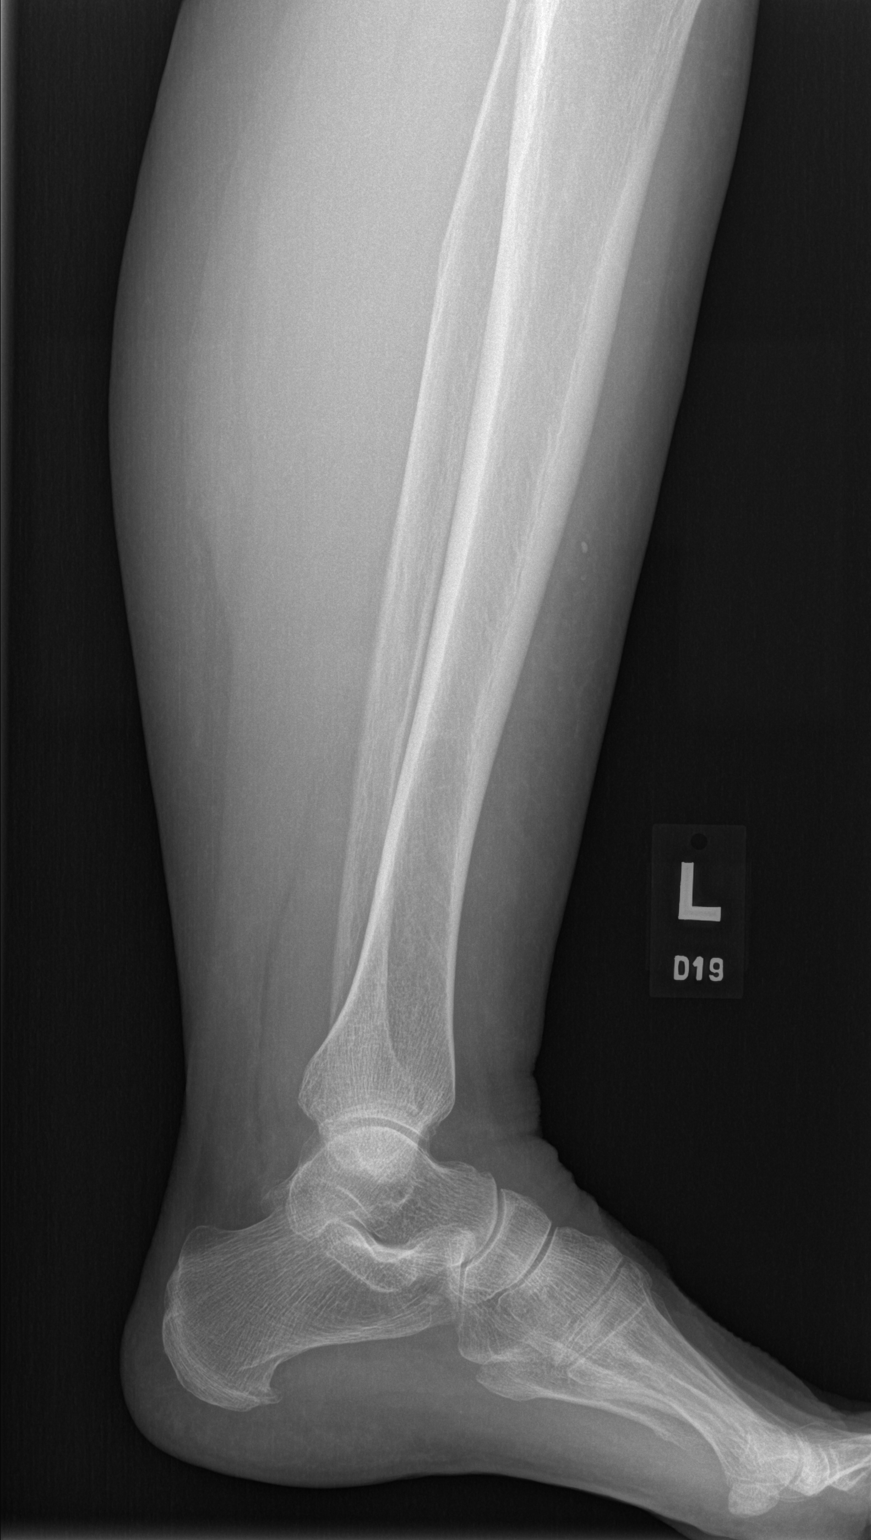

[t tib/fib lat left (2 of 2)]
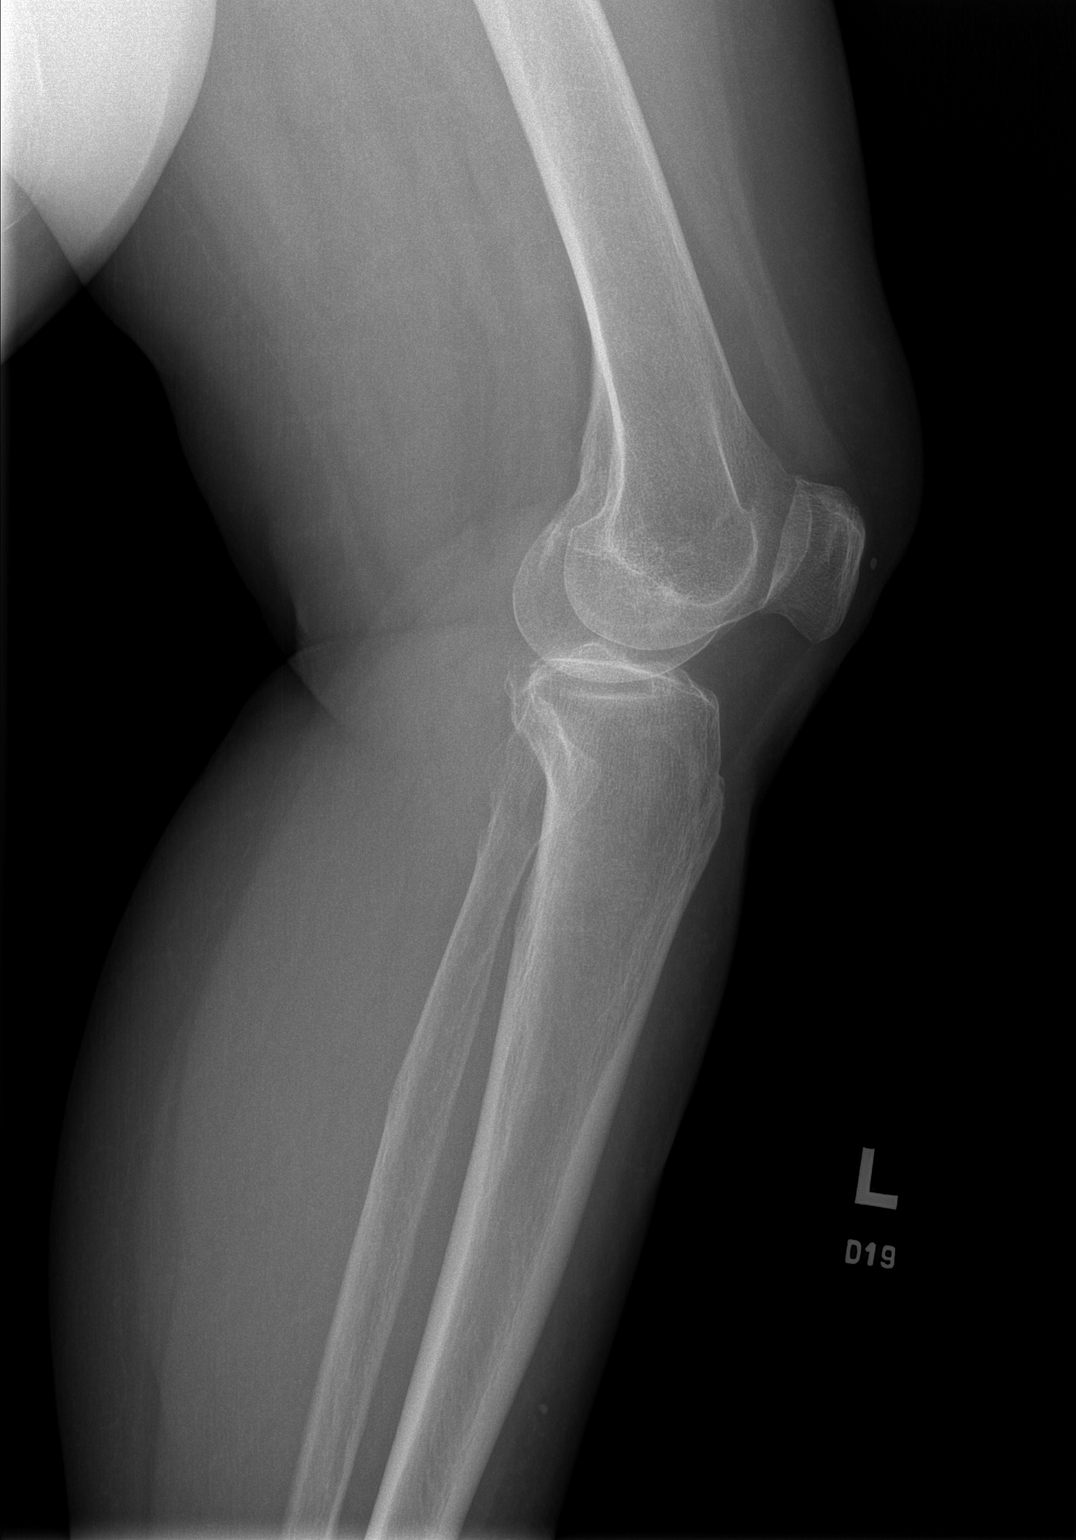

[t tib/fib ap left (1 of 2)]
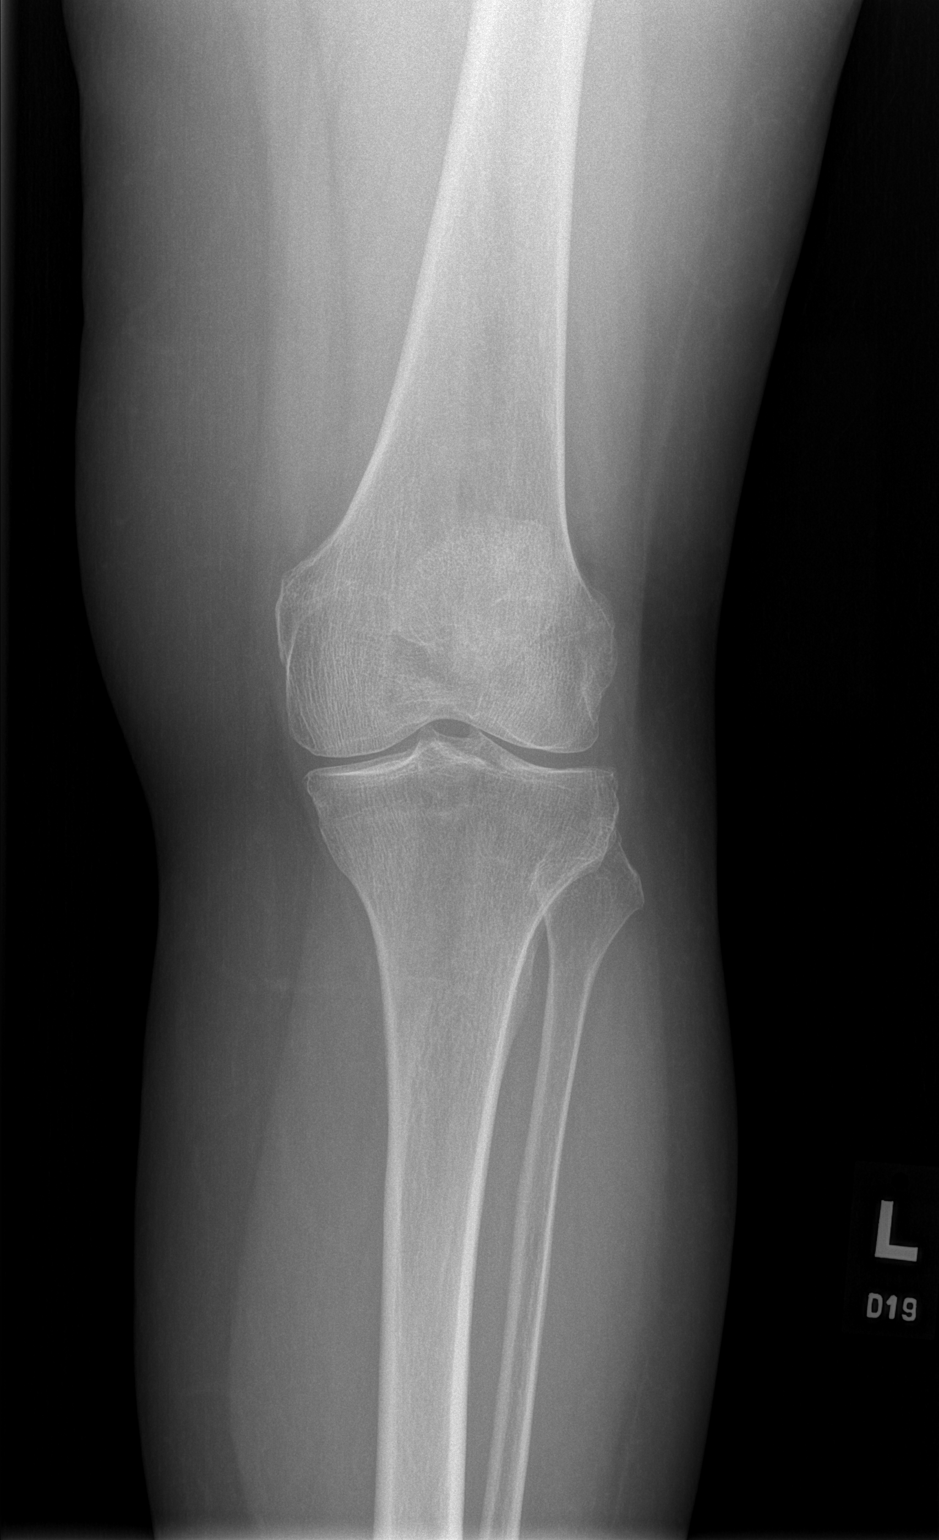

[t tib/fib ap left (2 of 2)]
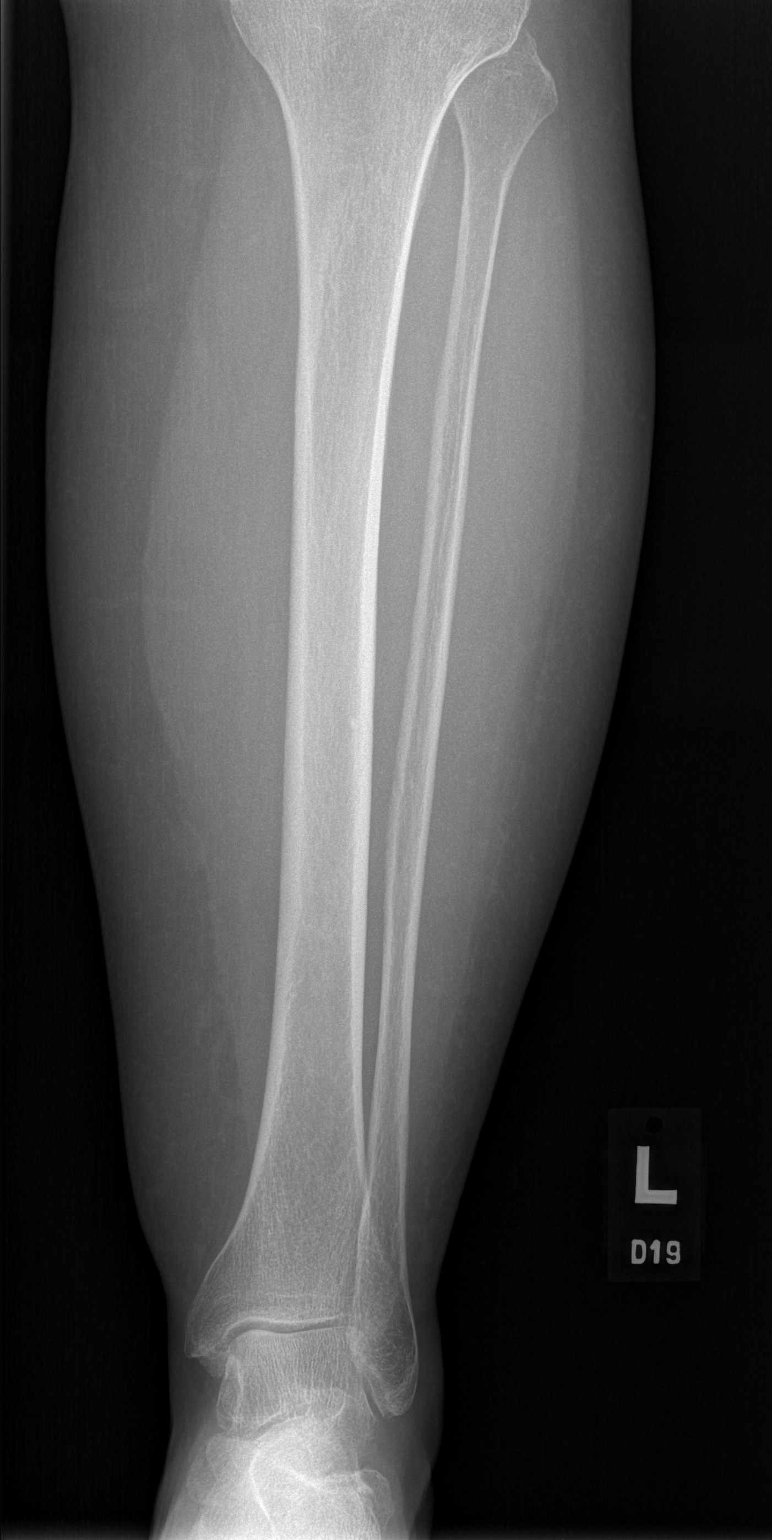

[4 of 4 positions shown; findings below may reference images not displayed]

FINDINGS: There is no evidence of fracture or other focal bone lesions. Soft
tissues are unremarkable.
IMPRESSION: No acute abnormality.
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
## 2023-05-17 ENCOUNTER — Telehealth: Payer: Self-pay | Admitting: Medical

## 2023-05-17 NOTE — Telephone Encounter (Signed)
 Copied from CRM [id number]. Topic: Medicare AWV >> May 17, 2023  9:52 AM Juliana Ocean wrote: Reason for CRM: LVM 05/17/2023 to schedule AWV. Please schedule Virtual or Telehealth visits ONLY  Rosalee Collins; Care Guide Ambulatory Clinical Support [place] [facility] Direct Dial: [phone]

## 2023-10-24 DIAGNOSIS — K080 Exfoliation of teeth due to systemic causes: Secondary | ICD-10-CM | POA: Diagnosis not present

## 2023-11-16 ENCOUNTER — Emergency Department (HOSPITAL_COMMUNITY)
Admission: EM | Admit: 2023-11-16 | Discharge: 2023-11-17 | Disposition: A | Discharge status: Home / Self Care | Attending: Emergency Medicine | Admitting: Emergency Medicine

## 2023-11-16 ENCOUNTER — Emergency Department (HOSPITAL_COMMUNITY)

## 2023-11-16 ENCOUNTER — Encounter (HOSPITAL_COMMUNITY): Payer: Self-pay

## 2023-11-16 ENCOUNTER — Other Ambulatory Visit: Payer: Self-pay

## 2023-11-16 DIAGNOSIS — G459 Transient cerebral ischemic attack, unspecified: Secondary | ICD-10-CM | POA: Diagnosis not present

## 2023-11-16 DIAGNOSIS — M7989 Other specified soft tissue disorders: Secondary | ICD-10-CM | POA: Diagnosis not present

## 2023-11-16 DIAGNOSIS — H31092 Other chorioretinal scars, left eye: Secondary | ICD-10-CM | POA: Diagnosis not present

## 2023-11-16 DIAGNOSIS — H5462 Unqualified visual loss, left eye, normal vision right eye: Secondary | ICD-10-CM | POA: Diagnosis not present

## 2023-11-16 DIAGNOSIS — H538 Other visual disturbances: Secondary | ICD-10-CM | POA: Diagnosis not present

## 2023-11-16 DIAGNOSIS — R9082 White matter disease, unspecified: Secondary | ICD-10-CM | POA: Diagnosis not present

## 2023-11-16 DIAGNOSIS — H547 Unspecified visual loss: Secondary | ICD-10-CM | POA: Diagnosis not present

## 2023-11-16 LAB — COMPREHENSIVE METABOLIC PANEL WITH GFR
ALT: 26 U/L (ref 0–44)
AST: 29 U/L (ref 15–41)
Albumin: 4.2 g/dL (ref 3.5–5.0)
Alkaline Phosphatase: 87 U/L (ref 38–126)
Anion gap: 13 (ref 5–15)
BUN: 13 mg/dL (ref 8–23)
CO2: 23 mmol/L (ref 22–32)
Calcium: 8.7 mg/dL — ABNORMAL LOW (ref 8.9–10.3)
Chloride: 103 mmol/L (ref 98–111)
Creatinine, Ser: 0.97 mg/dL (ref 0.44–1.00)
GFR, Estimated: >60 mL/min (ref >60)
Glucose, Bld: 114 mg/dL — ABNORMAL HIGH (ref 70–99)
Potassium: 3.6 mmol/L (ref 3.5–5.1)
Sodium: 139 mmol/L (ref 135–145)
Total Bilirubin: 0.6 mg/dL (ref 0.0–1.2)
Total Protein: 7.1 g/dL (ref 6.5–8.1)

## 2023-11-16 LAB — CBC WITH DIFFERENTIAL/PLATELET
Abs Immature Granulocytes: 0.03 K/uL (ref 0.00–0.07)
Basophils Absolute: 0.1 K/uL (ref 0.0–0.1)
Basophils Relative: 1 %
Eosinophils Absolute: 0.4 K/uL (ref 0.0–0.5)
Eosinophils Relative: 5 %
HCT: 43.4 % (ref 36.0–46.0)
Hemoglobin: 14.3 g/dL (ref 12.0–15.0)
Immature Granulocytes: 0 %
Lymphocytes Relative: 34 %
Lymphs Abs: 2.5 K/uL (ref 0.7–4.0)
MCH: 30.2 pg (ref 26.0–34.0)
MCHC: 32.9 g/dL (ref 30.0–36.0)
MCV: 91.6 fL (ref 80.0–100.0)
Monocytes Absolute: 0.4 K/uL (ref 0.1–1.0)
Monocytes Relative: 6 %
Neutro Abs: 3.9 K/uL (ref 1.7–7.7)
Neutrophils Relative %: 54 %
Platelets: 210 K/uL (ref 150–400)
RBC: 4.74 MIL/uL (ref 3.87–5.11)
RDW: 13 % (ref 11.5–15.5)
WBC: 7.2 K/uL (ref 4.0–10.5)
nRBC: 0 % (ref 0.0–0.2)

## 2023-11-16 LAB — RESP PANEL BY RT-PCR (RSV, FLU A&B, COVID)  RVPGX2
Influenza A by PCR: NEGATIVE
Influenza B by PCR: NEGATIVE
Resp Syncytial Virus by PCR: NEGATIVE
SARS Coronavirus 2 by RT PCR: NEGATIVE

## 2023-11-16 LAB — PROTIME-INR
INR: 0.9 (ref 0.8–1.2)
Prothrombin Time: 13.2 s (ref 11.4–15.2)

## 2023-11-16 LAB — HM DIABETES EYE EXAM

## 2023-11-16 LAB — TROPONIN I (HIGH SENSITIVITY): Troponin I (High Sensitivity): 3 ng/L (ref <18)

## 2023-11-16 MED ORDER — IOHEXOL 350 MG/ML SOLN
75.0000 mL | Freq: Once | INTRAVENOUS | Status: AC | PRN
  Administered 2023-11-16: 75 mL via INTRAVENOUS

## 2023-11-16 NOTE — ED Triage Notes (Signed)
 Pt to er, pt states that she is here because she was told that she had a TIA in her L eye this am, states that she couldn't see out of the bottom part of her eye for about two minutes, states that she called her eye doctor.  States that she went to the eye doctor, and her eyes are fine, but she was sent here to make sure that she doesn't have a stroke.  Pt denies any stroke like symptoms.

## 2023-11-16 NOTE — ED Triage Notes (Signed)
 Lost the vision in her lt eye earlier  she was diagnosed with a tia  and she has regained her vision in her lt eye she came from the eye doctor

## 2023-11-16 NOTE — ED Provider Triage Note (Signed)
 Emergency Medicine Provider Triage Evaluation Note  Karen Proctor , a 73 y.o. female  was evaluated in triage.  Pt complains of grey curtain over bottom half of L eye that happened acutely and lasted approximately 2 minutes. Saw some electrical stars in peripheral vision when vision was coming back on board. Feels back to her baseline.   Sent over for possible TIA, seen by her eye doctor and was cleared and told to come to the ED for TIA evaluation.   Endorses congestion, chronic diarrhea, chronic bilateral leg swelling.   Denies limb involvement, numbness, tingling, decreased sensation, facial drooping.   Denies fever, headache, vertigo, chest pain, shortness of breath, abdominal pain, n/v, LE swelling, dysuria.   Review of Systems  Positive: N/a Negative: N/a  Physical Exam  BP 139/78   Pulse 81   Resp 18   Ht 4' 11 (1.499 m)   Wt 70.3 kg   SpO2 100%   BMI 31.31 kg/m  Gen:   Awake, no distress   Resp:  Normal effort  MSK:   Moves extremities without difficulty  Other:    Medical Decision Making  Medically screening exam initiated at 5:36 PM.  Appropriate orders placed.  Karen Proctor was informed that the remainder of the evaluation will be completed by another provider, this initial triage assessment does not replace that evaluation, and the importance of remaining in the ED until their evaluation is complete.     Beola Terrall RAMAN, [place] 11/16/23 1740

## 2023-11-17 ENCOUNTER — Emergency Department (HOSPITAL_COMMUNITY)

## 2023-11-17 DIAGNOSIS — H547 Unspecified visual loss: Secondary | ICD-10-CM | POA: Diagnosis not present

## 2023-11-17 DIAGNOSIS — R29818 Other symptoms and signs involving the nervous system: Secondary | ICD-10-CM | POA: Diagnosis not present

## 2023-11-17 DIAGNOSIS — G459 Transient cerebral ischemic attack, unspecified: Secondary | ICD-10-CM | POA: Diagnosis not present

## 2023-11-17 NOTE — ED Provider Notes (Signed)
 [place] EMERGENCY DEPARTMENT AT [facility] Provider Note   CSN: [id number] Arrival date & time: 11/16/23  1641     Patient presents with: Eye Problem   Karen Proctor is a 73 y.o. female.   Patient presents to the emergency department for evaluation of possible TIA.  Patient reports that she was at work today when she had loss of vision and what she felt was her left eye.  She reports that the vision went gray for about 2 minutes, then she had some sparkles and things went back to normal.  No pain.  No recurrent symptoms, has never had similar symptoms.  She reports that she went to her eye doctor who did a complete eye exam and told her that her retina and optic nerves were normal, she should come to the emergency department to rule out a stroke.       Prior to Admission medications   Medication Sig Start Date End Date Taking? Authorizing Provider  levothyroxine  (SYNTHROID ) 75 MCG tablet Take 1 tablet once daily 6 days a week, skip in Sundays 01/24/23   Thapa, Sudan, MD  liothyronine  (CYTOMEL ) 5 MCG tablet 1 tablet 6 days a week, skip on Sundays 01/24/23   Thapa, Sudan, MD    Allergies: Gluten meal    Review of Systems  Updated Vital Signs BP 126/81 (BP Location: Right Arm)   Pulse 76   Temp (!) 97.5 F (36.4 C)   Resp 16   Ht 4' 11 (1.499 m)   Wt 70.3 kg   SpO2 98%   BMI 31.31 kg/m   Physical Exam Vitals and nursing note reviewed.  Constitutional:      General: She is not in acute distress.    Appearance: She is well-developed.  HENT:     Head: Normocephalic and atraumatic.     Mouth/Throat:     Mouth: Mucous membranes are moist.  Eyes:     General: Vision grossly intact. Gaze aligned appropriately.     Extraocular Movements: Extraocular movements intact.     Conjunctiva/sclera: Conjunctivae normal.  Cardiovascular:     Rate and Rhythm: Normal rate and regular rhythm.     Pulses: Normal pulses.     Heart sounds: Normal heart  sounds, S1 normal and S2 normal. No murmur heard.    No friction rub. No gallop.  Pulmonary:     Effort: Pulmonary effort is normal. No respiratory distress.     Breath sounds: Normal breath sounds.  Abdominal:     General: Bowel sounds are normal.     Palpations: Abdomen is soft.     Tenderness: There is no abdominal tenderness. There is no guarding or rebound.     Hernia: No hernia is present.  Musculoskeletal:        General: No swelling.     Cervical back: Full passive range of motion without pain, normal range of motion and neck supple. No spinous process tenderness or muscular tenderness. Normal range of motion.     Right lower leg: No edema.     Left lower leg: No edema.  Skin:    General: Skin is warm and dry.     Capillary Refill: Capillary refill takes less than 2 seconds.     Findings: No ecchymosis, erythema, rash or wound.  Neurological:     General: No focal deficit present.     Mental Status: She is alert and oriented to person, place, and time.  GCS: GCS eye subscore is 4. GCS verbal subscore is 5. GCS motor subscore is 6.     Cranial Nerves: Cranial nerves 2-12 are intact.     Sensory: Sensation is intact.     Motor: Motor function is intact.     Coordination: Coordination is intact.  Psychiatric:        Attention and Perception: Attention normal.        Mood and Affect: Mood normal.        Speech: Speech normal.        Behavior: Behavior normal.     (all labs ordered are listed, but only abnormal results are displayed) Labs Reviewed  COMPREHENSIVE METABOLIC PANEL WITH GFR - Abnormal; Notable for the following components:      Result Value   Glucose, Bld 114 (*)    Calcium 8.7 (*)    All other components within normal limits  RESP PANEL BY RT-PCR (RSV, FLU A&B, COVID)  [id number]  CBC WITH DIFFERENTIAL/PLATELET  PROTIME-INR  URINALYSIS, ROUTINE W REFLEX MICROSCOPIC  TROPONIN I (HIGH SENSITIVITY)    EKG: None  Radiology: MR BRAIN WO CONTRAST Result  Date: 11/17/2023 EXAM: MRI BRAIN WITHOUT CONTRAST 11/17/2023 02:10:42 AM TECHNIQUE: Multiplanar multisequence MRI of the head/brain was performed without the administration of intravenous contrast. COMPARISON: Same day CTA head/neck CLINICAL HISTORY: Neuro deficit, acute, stroke suspected FINDINGS: BRAIN AND VENTRICLES: No acute infarct. No intracranial hemorrhage. No mass. No midline shift. No hydrocephalus. Moderate T2 hyperintensity in the white matter, nonspecific but compatible with chronic microvascular ischemic change. Normal flow voids. ORBITS: No acute abnormality. SINUSES AND MASTOIDS: Sinuses. Trace mastoid effusions. BONES AND SOFT TISSUES: Normal marrow signal. No acute soft tissue abnormality. IMPRESSION: 1. No acute intracranial abnormality. Electronically signed by: Gilmore Molt MD 11/17/2023 02:22 AM EST RP Workstation: [id number]   CT ANGIO HEAD NECK W WO CM Result Date: 11/16/2023 EXAM: CTA Head and Neck with Intravenous Contrast. CT Head without Contrast. CLINICAL HISTORY: Transient ischemic attack (TIA); Vision loss, monocular. TECHNIQUE: Axial CTA images of the head and neck performed with intravenous contrast. MIP reconstructed images were created and reviewed. Axial computed tomography images of the head/brain performed without intravenous contrast. Note: Per PQRS, the description of internal carotid artery percent stenosis, including 0 percent or normal exam, is based on North American Symptomatic Carotid Endarterectomy Trial (NASCET) criteria. Dose reduction technique was used including one or more of the following: automated exposure control, adjustment of mA and kV according to patient size, and/or iterative reconstruction. CONTRAST: Without and with; 75mL (iohexol (OMNIPAQUE) 350 MG/ML injection 75 mL IOHEXOL 350 MG/ML SOLN). COMPARISON: None provided. FINDINGS: BRAIN: No acute intraparenchymal hemorrhage. No mass lesion. No CT evidence for acute territorial infarct. No midline  shift or extra-axial collection. Moderate patchy white matter hypodensities are nonspecific but compatible with chronic microvascular ischemic change. VENTRICLES: No hydrocephalus. ORBITS: The orbits are unremarkable. SINUSES AND MASTOIDS: The paranasal sinuses and mastoid air cells are clear. COMMON CAROTID ARTERIES: No significant stenosis. No dissection or occlusion. INTERNAL CAROTID ARTERIES: No stenosis by NASCET criteria. No dissection or occlusion. VERTEBRAL ARTERIES: No significant stenosis. No dissection or occlusion. ANTERIOR CEREBRAL ARTERIES: No significant stenosis. No occlusion. No aneurysm. MIDDLE CEREBRAL ARTERIES: No significant stenosis. No occlusion. No aneurysm. POSTERIOR CEREBRAL ARTERIES: No significant stenosis. No occlusion. No aneurysm. BASILAR ARTERY: No significant stenosis. No occlusion. No aneurysm. SOFT TISSUES: No acute finding. No masses or lymphadenopathy. BONES: No acute osseous abnormality. IMPRESSION: 1. No evidence of acute intracranial abnormality. 2. No  large vessel occlusion or proximal hemodynamically significant stenosis. Electronically signed by: Gilmore Molt MD 11/16/2023 08:47 PM EST RP Workstation: [id number]     Procedures   Medications Ordered in the ED  iohexol (OMNIPAQUE) 350 MG/ML injection 75 mL (75 mLs Intravenous Contrast Given 11/16/23 2019)          ABCD2 Score: 1                         Medical Decision Making  Differential diagnosis considered includes, but not limited to:  TIA; Stroke; seizure; Complex Migraine; metabolic encephalopathy  Patient asymptomatic at time of evaluation.  Vision back to normal.  MRI negative.  CT angiography without acute problems.  No stenosis.  ABCD2 score is 1.  Patient will be initiated on aspirin and is to follow-up with neurology.  Given return precautions.     Final diagnoses:  TIA (transient ischemic attack)    ED Discharge Orders          Ordered    Ambulatory referral to Neurology        Comments: An appointment is requested in approximately: 2 weeks   11/17/23 0539               Haze Lonni PARAS, MD 11/17/23 [phone]

## 2023-11-23 ENCOUNTER — Encounter: Payer: Self-pay | Admitting: Neurology

## 2023-11-28 ENCOUNTER — Ambulatory Visit: Admitting: Medical

## 2023-11-28 VITALS — BP 124/82 | HR 76 | Temp 97.8°F | Resp 15 | Ht 59.0 in | Wt 158.2 lb

## 2023-11-28 DIAGNOSIS — M25551 Pain in right hip: Secondary | ICD-10-CM

## 2023-11-28 DIAGNOSIS — F4311 Post-traumatic stress disorder, acute: Secondary | ICD-10-CM

## 2023-11-28 DIAGNOSIS — E785 Hyperlipidemia, unspecified: Secondary | ICD-10-CM

## 2023-11-28 DIAGNOSIS — M26609 Unspecified temporomandibular joint disorder, unspecified side: Secondary | ICD-10-CM

## 2023-11-28 DIAGNOSIS — G459 Transient cerebral ischemic attack, unspecified: Secondary | ICD-10-CM | POA: Diagnosis not present

## 2023-11-28 DIAGNOSIS — E039 Hypothyroidism, unspecified: Secondary | ICD-10-CM | POA: Diagnosis not present

## 2023-11-28 DIAGNOSIS — F439 Reaction to severe stress, unspecified: Secondary | ICD-10-CM

## 2023-11-28 DIAGNOSIS — R197 Diarrhea, unspecified: Secondary | ICD-10-CM

## 2023-11-28 LAB — TSH: TSH: 2.49 u[IU]/mL (ref 0.35–5.50)

## 2023-11-28 LAB — T4, FREE: Free T4: 0.55 ng/dL — ABNORMAL LOW (ref 0.60–1.60)
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
## 2023-11-28 NOTE — Patient Instructions (Addendum)
 Transient ischemic attack (TIA) with transient left eye vision loss (amaurosis fugax) Recent TIA with transient left eye vision loss, likely amaurosis fugax. MRI showed chronic microvascular changes. No occlusion in carotid arteries or brain on CT and MRI. Increased risk of future stroke due to TIA. - Continue baby aspirin daily. - Referred to neurology for further evaluation. - Consider referral to cardiology for cardiac CT study after neurology consultation. -EKG today  sinus rhythm with t wave change v1, v2 and v3  Right hip pain, likely trochanteric bursitis Right hip pain, likely trochanteric bursitis. Pain localized over trochanter, suggesting bursitis rather than tendinopathy. - Referred to sports medicine for evaluation of hip pain.  Chronic diarrhea, possible irritable bowel syndrome (IBS-D) Chronic diarrhea, possibly IBS-D. No formal diagnosis of IBS-D or celiac disease. - Ordered stool studies to rule out infectious causes. - Refer to gastroenterology if stool studies are negative.  Hypothyroidism Managed by endocrinology. Reports symptoms suggestive of thyroid  imbalance. - Ordered TSH and T4 levels.  Hyperlipidemia Total cholesterol 236 mg/dL and LDL 856 mg/dL. Discussed potential benefits of statins in reducing cardiovascular risk. - Ordered lipid panel. - Discussed potential referral to cardiology for further evaluation. 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 - Continue to monitor symptoms and consider stress management techniques.   Chronic stress and trauma-related symptoms Chronic stress and trauma-related symptoms, including brain fog, pressure in head, and TMJ dysfunction. - Continue to coordinate care with neurologist as you have hope neurologist might off some degree of holistic approach. -  Consider referral to regular counseling if needed.  Follow up date to be determined after lab review

## 2023-11-29 ENCOUNTER — Ambulatory Visit: Payer: Self-pay | Admitting: Medical

## 2023-11-29 LAB — LIPID PANEL
Cholesterol: 236 mg/dL — ABNORMAL HIGH (ref 0–200)
HDL: 54.1 mg/dL (ref >39.00)
LDL Cholesterol: 153 mg/dL — ABNORMAL HIGH (ref 0–99)
NonHDL: 181.62
Total CHOL/HDL Ratio: 4
Triglycerides: 144 mg/dL (ref 0.0–149.0)
VLDL: 28.8 mg/dL (ref 0.0–40.0)

## 2023-11-30 ENCOUNTER — Telehealth: Payer: Self-pay | Admitting: Endocrinology

## 2023-11-30 DIAGNOSIS — E039 Hypothyroidism, unspecified: Secondary | ICD-10-CM

## 2023-11-30 NOTE — Telephone Encounter (Signed)
 Patient called this afternoon and scheduled a follow up for 01/18/24,she had some recent lab testing done and would like Dr.Thapa to look over them and possibly work her in to be seen sooner.Please advise.

## 2023-12-01 NOTE — Telephone Encounter (Signed)
 Since the TSH is normal okay to keep follow-up visit with me in January as scheduled.  Check thyroid  function test 2 to 3 days prior to follow-up visit with me I have placed orders.

## 2023-12-13 NOTE — Progress Notes (Unsigned)
 NEUROLOGY CONSULTATION NOTE  Karen Proctor MRN: [id number] DOB: [birth year]  Referring provider: Lonni Seats, MD Primary care provider: Dallas Maxwell, PA-C  Reason for consult:  TIA  Assessment/Plan:   Left amaurosis fugax Hyperlipidemia Hypothyroidism   Continued stroke/TIA workup: Echocardiogram Zio patch cardiac monitoring Check Hgb A1c Secondary stroke prevention: ASA 81mg  daily Agreeable to start Zetia  10mg  daily as it is not known to elevate cortisol levels Normotensive blood pressure Hgb A1c goal less than 7 Mediterranean diet Routine exercise Follow up 6 months.  Total time spent on today's visit was 51 minutes dedicated to this patient today, preparing to see patient, examining the patient, ordering tests and/or medications and counseling the patient, documenting clinical information in the EHR or other health record, independently interpreting results and communicating results to the patient/family, discussing treatment and goals, answering patient's questions and coordinating care.     Subjective:  Discussed the use of AI scribe software for clinical note transcription with the patient, who gave verbal consent to proceed.  History of Present Illness Karen Proctor is a 73 year old female with hypothyroidism who presents with transient vision loss in the left eye. She was referred by her eye doctor for further evaluation.  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 She described it as 'like looking at a steel plate' with sparkly fireworks accompanying the return of her vision after about a minute and a half. No associated headache, facial droop, speech disturbance or unilateral numbness or weakness.  She immediately sought medical attention after her preschool duties.  Following the episode, she consulted with an eye specialist who  conducted a comprehensive eye examination, including tests for vision, peripheral vision, cornea, retina, optic nerve, and macular degeneration. She reports that the eye specialist told her there were no red flags on her eye exam and that she had experienced a transient ischemic attack (TIA).  She proceeded to the emergency room where she underwent a series of tests, including blood work, a CT scan, and an MRI. CTA head and neck showed no large vessel occlusion or proximal hemodynamically significant stenosis.  MRI of brain showed moderate chronic small vessel ischemic changes but no acute intracranial abnormality.. Troponin negative and CBC and CMP unremarkable.  She was discharged with instructions to take a baby aspirin daily.  She followed up with her PCP on 11/28/2023.  Blood work revealed tot chol 236, TG 144, LDL 153, TSH 2.49, and free T4 0.55.    No prior episodes of vision loss or headaches associated with this event. She has been feeling curious about the incident, noting it as a unique experience.  Her medical history includes hypothyroidism, for which she takes levothyroxine  and liothyronine . She has experienced episodes of becoming toxic from her thyroid  medication, which she manages by temporarily discontinuing the medication. She also reports a history of inflammation, which she believes may contribute to her health issues.  Therefore, she does not want to start a statin as it may increase her cortisol levels.  She also has history of family-related childhood trauma.  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   PAST MEDICAL HISTORY: Past Medical History:  Diagnosis Date   Adrenal insufficiency    Fibromyalgia    Hypothyroidism    Inflammatory arthritis 11/15/2013   Lead toxicity    Metabolic syndrome  Postmenopausal    Vitamin D  deficiency     PAST SURGICAL HISTORY: Past Surgical History:  Procedure  Laterality Date   APPENDECTOMY  1981   fibroid tumor     MYOMECTOMY  1993   OTHER SURGICAL HISTORY  1990   surgery for endometriosis and removal of fibroid tumor.   TOE SURGERY     TONSILLECTOMY  2009    MEDICATIONS: Current Outpatient Medications on File Prior to Visit  Medication Sig Dispense Refill   levothyroxine  (SYNTHROID ) 75 MCG tablet Take 1 tablet once daily 6 days a week, skip in Sundays 78 tablet 3   liothyronine  (CYTOMEL ) 5 MCG tablet 1 tablet 6 days a week, skip on Sundays 78 tablet 3   Current Facility-Administered Medications on File Prior to Visit  Medication Dose Route Frequency Provider Last Rate Last Admin   0.9 %  sodium chloride  infusion  500 mL Intravenous Once Nandigam, Kavitha V, MD        ALLERGIES: No Active Allergies  FAMILY HISTORY: Family History  Problem Relation Age of Onset   COPD Mother 84   Skin cancer Father    Colon cancer Neg Hx    Esophageal cancer Neg Hx    Rectal cancer Neg Hx    Stomach cancer Neg Hx    Thyroid  disease Neg Hx     Objective:  Blood pressure 117/80, pulse 91, height 4' 11 (1.499 m), weight 164 lb (74.4 kg), SpO2 98%. General: No acute distress.  Patient appears well-groomed.   Head:  Normocephalic/atraumatic Eyes:  fundi examined but not visualized Neck: supple, no paraspinal tenderness, full range of motion Heart: regular rate and rhythm Neurological Exam: Mental status: alert and oriented to person, place, and time, speech fluent and not dysarthric, language intact. Cranial nerves: CN I: not tested CN II: pupils equal, round and reactive to light, visual fields intact CN III, IV, VI:  full range of motion, no nystagmus, no ptosis CN V: facial sensation intact. CN VII: upper and lower face symmetric CN VIII: hearing intact CN IX, X: gag intact, uvula midline CN XI: sternocleidomastoid and trapezius muscles intact CN XII: tongue midline Bulk & Tone: normal, no fasciculations. Motor:  muscle strength 5/5  throughout Sensation:  Pinprick and vibratory sensation intact. Deep Tendon Reflexes:  2+ throughout,  toes downgoing.   Finger to nose testing:  Without dysmetria.   Gait:  Normal station and stride.  Romberg negative.    Thank you for allowing me to take part in the care of this patient.  Juliene Dunnings, DO  CC: Edward [name], PA-C

## 2023-12-14 ENCOUNTER — Encounter: Payer: Self-pay | Admitting: Neurology

## 2023-12-14 ENCOUNTER — Ambulatory Visit: Attending: Neurology

## 2023-12-14 ENCOUNTER — Ambulatory Visit: Admitting: Neurology

## 2023-12-14 VITALS — BP 117/80 | HR 91 | Ht 59.0 in | Wt 164.0 lb

## 2023-12-14 DIAGNOSIS — E039 Hypothyroidism, unspecified: Secondary | ICD-10-CM | POA: Diagnosis not present

## 2023-12-14 DIAGNOSIS — G453 Amaurosis fugax: Secondary | ICD-10-CM

## 2023-12-14 DIAGNOSIS — E78019 Familial hypercholesterolemia, unspecified: Secondary | ICD-10-CM | POA: Diagnosis not present

## 2023-12-14 MED ORDER — EZETIMIBE 10 MG PO TABS: 10.0000 mg | ORAL_TABLET | Freq: Every day | ORAL | 5 refills | Status: AC

## 2023-12-14 NOTE — Progress Notes (Unsigned)
Enrolled for [employer] to mail a ZIO AT Live Telemetry monitor to patients address on file.   DOD to read.

## 2023-12-14 NOTE — Patient Instructions (Addendum)
 Continue aspirin 81mg  daily Start ezetimibe 10mg  daily for cholesterol Check echocardiogram Check 2 week cardiac event monitor Check Hgb A1c. Your provider has requested that you have labwork completed today. Please go to [facility] ([id number]) on the second floor of this building before leaving the office today. You do not need to check in. If you are not called within 15 minutes please check with the front desk.   Mediterranean diet (see below) Routine exercise Follow up 6 months.  Mediterranean Diet A Mediterranean diet is based on the traditions of countries on the [employer]. It focuses on eating more: Fruits and vegetables. Whole grains, beans, nuts, and seeds. Heart-healthy fats. These are fats that are good for your heart. It involves eating less: Dairy. Meat and eggs. Processed foods with added sugar, salt, and fat. This type of diet can help prevent certain conditions. It can also improve outcomes if you have a long-term (chronic) disease, such as kidney or heart disease. What are tips for following this plan? Reading food labels Check packaged foods for: The serving size. For foods such as rice and pasta, the serving size is the amount of cooked product, not dry. The total fat. Avoid foods with saturated fat or trans fat. Added sugars, such as corn syrup. Shopping  Try to have a balanced diet. Buy a variety of foods, such as: Fresh fruits and vegetables. You may be able to get these from local farmers markets. You can also buy them frozen. Grains, beans, nuts, and seeds. Some of these can be bought in bulk. Fresh seafood. Poultry and eggs. Low-fat dairy products. Buy whole ingredients instead of foods that have already been packaged. If you can't get fresh seafood, buy precooked frozen shrimp or canned fish, such as tuna, salmon, or sardines. Stock your pantry so you always have certain foods on hand, such as olive oil, canned tuna, canned tomatoes,  rice, pasta, and beans. Cooking Cook foods with extra-virgin olive oil instead of using butter or other vegetable oils. Have meat as a side dish. Have vegetables or grains as your main dish. This means having meat in small portions or adding small amounts of meat to foods like pasta or stew. Use beans or vegetables instead of meat in common dishes like chili or lasagna. Try out different cooking methods. Try roasting, broiling, steaming, and sauting vegetables. Add frozen vegetables to soups, stews, pasta, or rice. Add nuts or seeds for added healthy fats and plant protein at each meal. You can add these to yogurt, salads, or vegetable dishes. Marinate fish or vegetables using olive oil, lemon juice, garlic, and fresh herbs. Meal planning Plan to eat a vegetarian meal one day each week. Try to work up to two vegetarian meals, if possible. Eat seafood two or more times a week. Have healthy snacks on hand. These may include: Vegetable sticks with hummus. Greek yogurt. Fruit and nut trail mix. Eat balanced meals. These should include: Fruit: 2-3 servings a day. Vegetables: 4-5 servings a day. Low-fat dairy: 2 servings a day. Fish, poultry, or lean meat: 1 serving a day. Beans and legumes: 2 or more servings a week. Nuts and seeds: 1-2 servings a day. Whole grains: 6-8 servings a day. Extra-virgin olive oil: 3-4 servings a day. Limit red meat and sweets to just a few servings a month. Lifestyle  Try to cook and eat meals with your family. Drink enough fluid to keep your pee (urine) pale yellow. Be active every day. This includes: Aerobic  exercise, which is exercise that causes your heart to beat faster. Examples include running and swimming. Leisure activities like gardening, walking, or housework. Get 7-8 hours of sleep each night. Drink red wine if your provider says you can. A glass of wine is 5 oz (150 mL). You may be allowed to have: Up to 1 glass a day if you're female and not  pregnant. Up to 2 glasses a day if you're female. What foods should I eat? Fruits Apples. Apricots. Avocado. Berries. Bananas. Cherries. Dates. Figs. Grapes. Lemons. Melon. Oranges. Peaches. Plums. Pomegranate. Vegetables Artichokes. Beets. Broccoli. Cabbage. Carrots. Eggplant. Green beans. Chard. Kale. Spinach. Onions. Leeks. Peas. Squash. Tomatoes. Peppers. Radishes. Grains Whole-grain pasta. Brown rice. Bulgur wheat. Polenta. Couscous. Whole-wheat bread. [place] Madeira. Meats and other proteins Beans. Almonds. Sunflower seeds. Pine nuts. Peanuts. Cod. Salmon. Scallops. Shrimp. Tuna. Tilapia. Clams. Oysters. Eggs. Chicken or turkey without skin. Dairy Low-fat milk. Cheese. Greek yogurt. Fats and oils Extra-virgin olive oil. Avocado oil. Grapeseed oil. Beverages Water. Red wine. Herbal tea. Sweets and desserts Greek yogurt with honey. Baked apples. Poached pears. Trail mix. Seasonings and condiments Basil. Cilantro. Coriander. Cumin. Mint. Parsley. Sage. Rosemary. Tarragon. Garlic. Oregano. Thyme. Pepper. Balsamic vinegar. Tahini. Hummus. Tomato sauce. Olives. Mushrooms. The items listed above may not be all the foods and drinks you can have. Talk to a dietitian to learn more. What foods should I limit? This is a list of foods that should be eaten rarely. Fruits Fruit canned in syrup. Vegetables Deep-fried potatoes, like French fries. Grains Packaged pasta or rice dishes. Cereal with added sugar. Snacks with added sugar. Meats and other proteins Beef. Pork. Lamb. Chicken or turkey with skin. Hot dogs. [place]. Dairy Ice cream. Sour cream. Whole milk. Fats and oils Butter. Canola oil. Vegetable oil. Beef fat (tallow). Lard. Beverages Juice. Sugar-sweetened soft drinks. Beer. Liquor and spirits. Sweets and desserts Cookies. Cakes. Pies. Candy. Seasonings and condiments Mayonnaise. Pre-made sauces and marinades. The items listed above may not be all the foods and drinks you  should limit. Talk to a dietitian to learn more. Where to find more information American Heart Association (AHA): heart.org This information is not intended to replace advice given to you by your health care provider. Make sure you discuss any questions you have with your health care provider. Document Revised: 04/11/2022 Document Reviewed: 04/11/2022 Elsevier Patient Education  2024 [employer].

## 2023-12-15 ENCOUNTER — Telehealth: Payer: Self-pay | Admitting: Neurology

## 2023-12-15 ENCOUNTER — Other Ambulatory Visit

## 2023-12-15 NOTE — Telephone Encounter (Signed)
Tried calling patient no answer. LMOVM to call the office.

## 2023-12-15 NOTE — Telephone Encounter (Signed)
 Pt would like a call back due to she has questions about the heart monitor. Thanks

## 2023-12-16 LAB — HEMOGLOBIN A1C
Hgb A1c MFr Bld: 5.6 % (ref <5.7)
Mean Plasma Glucose: 114 mg/dL
eAG (mmol/L): 6.3 mmol/L

## 2023-12-16 NOTE — Telephone Encounter (Signed)
 Pt has questions about monitor and needs to speak to someone. Please advise

## 2023-12-16 NOTE — Telephone Encounter (Signed)
 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 Please advise if this is okay.

## 2023-12-16 NOTE — Telephone Encounter (Signed)
 Patient wanted to know if she should hold off on putting the Heart monitor on before her procedure at the Dentist or wait?    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   Advised patient I know that the Monitor had to be order and enrolled by [facility]. These questions would be best answer by they office. To make sure how long before the Enrollment expire before she can put it on.   As for the Dentist appt if she feels uncomfortable to wear the Monitor during her Procedure that is up to her.    Please advise. [facility] number given to patient as well.    Will advised Dr.Jaffe of the note as well. He is out of the office today.

## 2023-12-18 ENCOUNTER — Ambulatory Visit: Payer: Self-pay | Admitting: Neurology

## 2023-12-19 DIAGNOSIS — D0359 Melanoma in situ of other part of trunk: Secondary | ICD-10-CM | POA: Diagnosis not present

## 2023-12-19 DIAGNOSIS — L821 Other seborrheic keratosis: Secondary | ICD-10-CM | POA: Diagnosis not present

## 2023-12-19 DIAGNOSIS — L814 Other melanin hyperpigmentation: Secondary | ICD-10-CM | POA: Diagnosis not present

## 2023-12-19 DIAGNOSIS — L738 Other specified follicular disorders: Secondary | ICD-10-CM | POA: Diagnosis not present

## 2023-12-19 DIAGNOSIS — L820 Inflamed seborrheic keratosis: Secondary | ICD-10-CM | POA: Diagnosis not present

## 2023-12-19 NOTE — Progress Notes (Signed)
 Patient advised.

## 2023-12-19 NOTE — Telephone Encounter (Signed)
'  Per patient [facility] called her back and she was advised on what to do.

## 2023-12-21 NOTE — Progress Notes (Signed)
 Echocardiogram Prior authorization faxed into [facility] 12/21/2023. Pending determination.

## 2023-12-27 NOTE — Progress Notes (Signed)
 Letter received today, Echo approved 12/22/23-02/22/24

## 2024-01-03 ENCOUNTER — Other Ambulatory Visit: Payer: Self-pay | Admitting: Neurology

## 2024-01-03 DIAGNOSIS — E78019 Familial hypercholesterolemia, unspecified: Secondary | ICD-10-CM

## 2024-01-03 DIAGNOSIS — G453 Amaurosis fugax: Secondary | ICD-10-CM

## 2024-01-03 DIAGNOSIS — G459 Transient cerebral ischemic attack, unspecified: Secondary | ICD-10-CM

## 2024-01-03 DIAGNOSIS — E039 Hypothyroidism, unspecified: Secondary | ICD-10-CM

## 2024-01-13 ENCOUNTER — Other Ambulatory Visit

## 2024-01-14 LAB — T4, FREE: Free T4: 1 ng/dL (ref 0.8–1.8)

## 2024-01-14 LAB — TSH: TSH: 1.75 m[IU]/L (ref 0.40–4.50)

## 2024-01-14 LAB — T3, FREE: T3, Free: 3.2 pg/mL (ref 2.3–4.2)

## 2024-01-16 ENCOUNTER — Ambulatory Visit: Payer: Self-pay | Admitting: Endocrinology

## 2024-01-18 ENCOUNTER — Encounter: Payer: Self-pay | Admitting: Endocrinology

## 2024-01-18 ENCOUNTER — Ambulatory Visit: Admitting: Endocrinology

## 2024-01-18 ENCOUNTER — Other Ambulatory Visit

## 2024-01-18 ENCOUNTER — Ambulatory Visit (HOSPITAL_BASED_OUTPATIENT_CLINIC_OR_DEPARTMENT_OTHER)
Admission: RE | Admit: 2024-01-18 | Discharge: 2024-01-18 | Disposition: A | Discharge status: Home / Self Care | Source: Ambulatory Visit | Attending: Neurology | Admitting: Neurology

## 2024-01-18 VITALS — BP 110/70 | HR 79 | Resp 16 | Ht 59.0 in | Wt 164.4 lb

## 2024-01-18 DIAGNOSIS — G453 Amaurosis fugax: Secondary | ICD-10-CM

## 2024-01-18 DIAGNOSIS — E039 Hypothyroidism, unspecified: Secondary | ICD-10-CM | POA: Diagnosis not present

## 2024-01-18 DIAGNOSIS — G459 Transient cerebral ischemic attack, unspecified: Secondary | ICD-10-CM

## 2024-01-18 DIAGNOSIS — R197 Diarrhea, unspecified: Secondary | ICD-10-CM

## 2024-01-18 LAB — ECHOCARDIOGRAM COMPLETE
AR max vel: 1.53 cm2
AV Area VTI: 1.63 cm2
AV Area mean vel: 1.51 cm2
AV Mean grad: 5 mmHg
AV Peak grad: 8.8 mmHg
Ao pk vel: 1.48 m/s
Area-P 1/2: 3.93 cm2
Calc EF: 62 %
MV M vel: 4.33 m/s
MV Peak grad: 75 mmHg
S' Lateral: 2.1 cm
Single Plane A2C EF: 65.7 %
Single Plane A4C EF: 60.2 %

## 2024-01-18 MED ORDER — LIOTHYRONINE SODIUM 5 MCG PO TABS: ORAL_TABLET | ORAL | 3 refills | Status: AC

## 2024-01-18 MED ORDER — LEVOTHYROXINE SODIUM 75 MCG PO TABS: ORAL_TABLET | ORAL | 3 refills | Status: AC

## 2024-01-18 NOTE — Progress Notes (Signed)
 "  Outpatient Endocrinology Note Devanee Pomplun, MD   Patient's Name: Karen Proctor    DOB: Dec 20, 1950    MRN: [id number]  REASON OF VISIT: Follow-up for hypothyroidism  PCP: Dorina Loving, PA-C  HISTORY OF PRESENT ILLNESS:   Karen Proctor is a 74 y.o. old female with past medical history as listed below is presented for a follow up for hypothyroidism.   Pertinent Thyroid  History: Patient was previously seen by Dr. Von and was last time seen in May 2024.  Patient was diagnosed with hypothyroidism in 2004.  Patient had symptoms of significant fatigue at the time of diagnosis.She was seen by a holistic physician who told her that she was hypothyroid, not clear if she had a baseline thyroid  enlargement or abnormal antibody studies.  She was treated with Armour Thyroid  initially with improvement in her thyroid  symptoms. Subsequently she was also tried by another physician on levothyroxine  but she felt more tired with this. Since about 2011 or so she had been taking a combination of levothyroxine  and Cytomel .   The patient is partly followed in the [place] where she lived most of the time by a local endocrinologist Previous TSH levels done in the [place] since 2018 showed that TSH level had been low normal or low ranging between 0.11 and 0.54. Apparently in 2/21 in [place] she was having symptoms of chest pain and cardiac evaluation was negative.  She was told that her symptoms were related to side effects of Euthyrox  Review of her labs indicates that her TSH had been low in 07/01/2018 and 08/31/2018 but normal in 7/20.  In 2021 she was switched to Armour Thyroid  equivalent capsules in the [place]. On her follow-up visit in 9/21  she was having some feeling of weakness, chest discomfort similar to when she had over replacement with thyroid  hormones previously. Since her TSH was low at 0.11 without any increase in her T4/T3 levels she was switched to a combination of  levothyroxine  and Cytomel .  She is on 75 mcg of levothyroxine  and 5 mcg of liothyronine , both 6 days a week.  Not taking both on Sundays.  Apparently she starts getting palpitations and chest pain when her thyroid  levels are somewhat high.  Other: OSTEOPENIA with T-score -2.3: Managed by PCP, she does not want to take any pharmacological treatment despite discussion about potential fracture reduction previously.   Interval history Patient has been taking levothyroxine  75 mcg daily for 6 days and liothyronine  5 mcg daily for 6 days.  She does not take on Sundays.  Patient denies palpitation and heat intolerance.  Patient had possible TIA with transient vision loss in beginning of November, had imaging and neurological workup unremarkable so far.  Patient reports she felt toxic feeling ? possibly related to IV iodine contrast use with CT scan around mid to end of November and did not take levothyroxine  and liothyronine  for 2 to 3 days, after that she has been taking both.  Patient hypertension test normal.  No other complaints today.   Latest Reference Range & Units 01/13/24 14:51  TSH 0.40 - 4.50 mIU/L 1.75  Triiodothyronine,Free,Serum 2.3 - 4.2 pg/mL 3.2  T4,Free(Direct) 0.8 - 1.8 ng/dL 1.0     REVIEW OF SYSTEMS:  As per history of present illness.   PAST MEDICAL HISTORY: Past Medical History:  Diagnosis Date   Adrenal insufficiency    Fibromyalgia    Hypothyroidism    Inflammatory arthritis 11/15/2013   Lead toxicity    Metabolic syndrome  Postmenopausal    Vitamin D  deficiency     PAST SURGICAL HISTORY: Past Surgical History:  Procedure Laterality Date   APPENDECTOMY  1981   fibroid tumor     MYOMECTOMY  1993   OTHER SURGICAL HISTORY  1990   surgery for endometriosis and removal of fibroid tumor.   TOE SURGERY     TONSILLECTOMY  2009    ALLERGIES: No Active Allergies   FAMILY HISTORY:  Family History  Problem Relation Age of Onset   COPD Mother 70   Skin  cancer Father    Colon cancer Neg Hx    Esophageal cancer Neg Hx    Rectal cancer Neg Hx    Stomach cancer Neg Hx    Thyroid  disease Neg Hx     SOCIAL HISTORY: Social History   Socioeconomic History   Marital status: Divorced    Spouse name: Not on file   Number of children: Not on file   Years of education: Not on file   Highest education level: Master's degree (e.g., MA, MS, MEng, MEd, MSW, MBA)  Occupational History   Occupation: Retired  Tobacco Use   Smoking status: Never   Smokeless tobacco: Never  Vaping Use   Vaping status: Never Used  Substance and Sexual Activity   Alcohol use: Yes    Alcohol/week: 0.0 standard drinks of alcohol    Comment: very occasionally   Drug use: No   Sexual activity: Not on file  Other Topics Concern   Not on file  Social History Narrative   Are you right handed or left handed? right   Are you currently employed ?    What is your current occupation? [employer]   Do you live at home alone?yes   Who lives with you?    What type of home do you live in: 1 story or 2 story? one    Caffiene 1 cup a day   Social Drivers of Health   Tobacco Use: Low Risk (01/18/2024)   Patient History    Smoking Tobacco Use: Never    Smokeless Tobacco Use: Never    Passive Exposure: Not on file  Financial Resource Strain: Medium Risk (11/27/2023)   Overall Financial Resource Strain (CARDIA)    Difficulty of Paying Living Expenses: Somewhat hard  Food Insecurity: No Food Insecurity (11/27/2023)   Epic    Worried About Programme Researcher, Broadcasting/film/video in the Last Year: Never true    Ran Out of Food in the Last Year: Never true  Transportation Needs: No Transportation Needs (11/27/2023)   Epic    Lack of Transportation (Medical): No    Lack of Transportation (Non-Medical): No  Physical Activity: Insufficiently Active (11/27/2023)   Exercise Vital Sign    Days of Exercise per Week: 3 days    Minutes of Exercise per Session: 30 min  Stress:  Stress Concern Present (11/27/2023)   [employer] of Occupational Health - Occupational Stress Questionnaire    Feeling of Stress: To some extent  Social Connections: Moderately Integrated (11/27/2023)   Social Connection and Isolation Panel    Frequency of Communication with Friends and Family: More than three times a week    Frequency of Social Gatherings with Friends and Family: Once a week    Attends Religious Services: More than 4 times per year    Active Member of [employer] or Organizations: Yes    Attends Banker Meetings: More than 4 times per year  Marital Status: Divorced  Depression (PHQ2-9): Low Risk (11/28/2023)   Depression (PHQ2-9)    PHQ-2 Score: 1  Alcohol Screen: Low Risk (11/27/2023)   Alcohol Screen    Last Alcohol Screening Score (AUDIT): 1  Housing: Low Risk (11/27/2023)   Epic    Unable to Pay for Housing in the Last Year: No    Number of Times Moved in the Last Year: 1    Homeless in the Last Year: No  Utilities: Not on file  Health Literacy: Not on file    MEDICATIONS:  Current Outpatient Medications  Medication Sig Dispense Refill   aspirin EC 81 MG tablet Take 81 mg by mouth daily. Swallow whole.     ezetimibe  (ZETIA ) 10 MG tablet Take 1 tablet (10 mg total) by mouth daily. 30 tablet 5   NON FORMULARY Take 1 tablet by mouth daily at 6 (six) AM. Sciatic     NON FORMULARY Take 1 tablet by mouth daily. Provitalize     levothyroxine  (SYNTHROID ) 75 MCG tablet Take 1 tablet once daily 6 days a week, skip in Sundays 78 tablet 3   liothyronine  (CYTOMEL ) 5 MCG tablet 1 tablet 6 days a week, skip on Sundays 78 tablet 3   Current Facility-Administered Medications  Medication Dose Route Frequency Provider Last Rate Last Admin   0.9 %  sodium chloride  infusion  500 mL Intravenous Once Nandigam, Kavitha V, MD        PHYSICAL EXAM: Vitals:   01/18/24 1559  BP: 110/70  Pulse: 79  Resp: 16  SpO2: 98%  Weight: 164 lb 6.4 oz (74.6 kg)  Height:  4' 11 (1.499 m)   Body mass index is 33.2 kg/m.  Wt Readings from Last 3 Encounters:  01/18/24 164 lb 6.4 oz (74.6 kg)  12/14/23 164 lb (74.4 kg)  11/28/23 158 lb 3.2 oz (71.8 kg)    General: Well developed, well nourished female in no apparent distress.  HEENT: AT/[place], no external lesions. Hearing intact to the spoken word Eyes: Conjunctiva clear and no icterus. Neck: Trachea midline, neck supple without appreciable thyromegaly or lymphadenopathy and no palpable thyroid  nodules Lungs: Clear to auscultation, no wheeze. Respirations not labored Heart: S1S2, Regular in rate and rhythm.  Abdomen: Soft, non tender, non distended Neurologic: Alert, oriented, normal speech, deep tendon biceps reflexes 2-3+,  no gross focal neurological deficit Extremities:+ tremors of outstretched hands.  Patient reports she has central tremor. Skin: Warm, color good.  Psychiatric: Does not appear depressed or anxious  PERTINENT HISTORIC LABORATORY AND IMAGING STUDIES:  All pertinent laboratory results were reviewed. Please see HPI also for further details.   TSH  Date Value Ref Range Status  01/13/2024 1.75 0.40 - 4.50 mIU/L Final  11/28/2023 2.49 0.35 - 5.50 uIU/mL Final  01/18/2023 2.93 0.40 - 4.50 mIU/L Final     ASSESSMENT / PLAN  1. Adult hypothyroidism   2. Primary hypothyroidism    -Patient has hypothyroidism since 2004.  She was on Armour Thyroid  or levothyroxine  at different time in the past.  Lately she has been on combination therapy with levothyroxine  and liothyronine .  She used to partly follow-up with endocrinology in [place] in the past. - She is clinically euthyroid.  Recent thyroid  function test normal.  Plan: -Continue current dose of levothyroxine  75 mcg daily for 6 days a week and liothyronine  5 mcg daily for 6 days a week, skipping both on Sundays. -Discussed follow-up with endocrinology 31-month versus 1 year.  Patient preferred to follow-up  annually.  Encouraged to call  our clinic with any question in between the visits. -Endocrinology follow-up annually.   Diagnoses and all orders for this visit:  Adult hypothyroidism -     T4, free -     T3, free -     TSH -     liothyronine  (CYTOMEL ) 5 MCG tablet; 1 tablet 6 days a week, skip on Sundays -     levothyroxine  (SYNTHROID ) 75 MCG tablet; Take 1 tablet once daily 6 days a week, skip in Sundays  Primary hypothyroidism    DISPOSITION Follow up in clinic in 12 months suggested.  Labs prior to follow-up visit.  All questions answered and patient verbalized understanding of the plan.  Blimie Vaness, MD [facility] [address] [place], [address] [place], [place] [address] Phone # [phone]  At least part of this note was generated using voice recognition software. Inadvertent word errors may have occurred, which were not recognized during the proofreading process.    "

## 2024-01-19 ENCOUNTER — Ambulatory Visit: Payer: Self-pay | Admitting: Neurology

## 2024-01-19 DIAGNOSIS — G453 Amaurosis fugax: Secondary | ICD-10-CM | POA: Diagnosis not present

## 2024-01-19 LAB — CLOSTRIDIUM DIFFICILE BY PCR: Toxigenic C. Difficile by PCR: NEGATIVE

## 2024-01-20 LAB — OVA AND PARASITE EXAMINATION
CONCENTRATE RESULT:: NONE SEEN
MICRO NUMBER:: 17437765
SPECIMEN QUALITY:: ADEQUATE
TRICHROME RESULT:: NONE SEEN

## 2024-01-20 NOTE — Progress Notes (Signed)
 Patient advised.

## 2024-01-20 NOTE — Progress Notes (Unsigned)
 "               [facility] [address] [place] [address] Phone: [phone]   Assessment and Plan:     1. Chronic right-sided low back pain with right-sided sciatica (Primary) 2. Anterolisthesis of lumbar spine 3. 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  Additional lateral hip pain consistent with greater trochanteric bursitis/IT band syndrome, I suspect due to compensation from radiculopathy causing gluteal strain and greater trochanteric irritation -Do not recommend p.o. NSAIDs or prednisone with current workup for TIA - Use Tylenol 500 to 1000 mg tablets 2-3 times a day for day-to-day pain relief -Means topical Voltaren gel over areas of pain - Reviewed patient's lumbar x-ray which showed DDD most pronounced at L4-L5, grade 1 anterolisthesis of lumbar spine - Start HEP and physical therapy for sciatica, low back, hip - Recommend MRI of lumbar spine due to failure to improve despite >6 weeks of conservative therapy, pain with day-to-day activities, pain >6/10  15 additional minutes spent for educating Therapeutic Home Exercise Program.  This included exercises focusing on stretching, strengthening, with focus on eccentric aspects.   Long term goals include an improvement in range of motion, strength, endurance as well as avoiding reinjury. Patient's frequency would include in 1-2 times a day, 3-5 times a week for a duration of 6-12 weeks. Proper technique shown and discussed handout in great detail with ATC.  All questions were discussed and answered.      Pertinent previous records reviewed include lumbar and hip x-ray 05/17/2022   Follow Up: 1 week after MRI to review results.  Could consider epidural CSI.  Patient ideally is trying to avoid corticosteroids due to concern for chronic side  effects   Subjective:   I, Karen Proctor, am serving as a neurosurgeon for Doctor Morene Mace  Chief Complaint: right hip pain   HPI:  07/27/2022 Making progress but still has some more of this type of pain as well as gluteal pain and piriformis.  Will continue to monitor.  Discussed the potential for dry needling.  Patient does not know if she would like to have this done or not.  She will consider it and still work with physical therapy for another 4 weeks.  Then I would like patient to transition into her exercise routine and follow-up with me 4 weeks after that.  See how patient is doing and if worsening symptoms advanced imaging may be warranted.  Update 09/28/2022 Karen Proctor is a 74 y.o. female coming in with complaint of R hip pain. Patient states that her pain intensity and duration have improved. Pain is worse at night. She has dull pain during the day. Continues to feel like her R side is weaker than her left side.   01/23/2024 Patient is a 74 year old female with right hip pain. Patient states she thinks her pain may be hormone related. She also thinks it could be glute related she has been doing some research. She has been to PT. She was also doing some exercise classes and those did help at the time. She has been taking supplements that she feels have been helping. She would like some answers as to why this is happening    Relevant Historical Information: Hypothyroidism, fibromyalgia  Additional pertinent review of systems negative.  Current Medications[1]  Objective:     Vitals:   01/23/24 1452  Pulse: 75  SpO2: 99%  Weight: 167 lb (75.8 kg)  Height: 4' 11 (1.499 m)      Body mass index is 33.73 kg/m.    Physical Exam:    Gen: Appears well, nad, nontoxic and pleasant Psych: Alert and oriented, appropriate mood and affect Neuro: sensation intact, strength is 5/5 in upper and lower extremities, muscle tone wnl Skin: no susupicious lesions or  rashes  Back - Normal skin, Spine with normal alignment and no deformity.   No tenderness to vertebral process palpation.   Paraspinous muscles are not tender and without spasm TTP greater trochanter, IT band NTTP gluteal musculature Straight leg raise negative Trendelenberg negative Piriformis Test negative for radicular symptoms, though reproduced gluteal strain Gait normal    Electronically signed by:  [facility] 3:32 PM 01/23/2024     [1]  Current Outpatient Medications:    aspirin EC 81 MG tablet, Take 81 mg by mouth daily. Swallow whole., Disp: , Rfl:    ezetimibe  (ZETIA ) 10 MG tablet, Take 1 tablet (10 mg total) by mouth daily., Disp: 30 tablet, Rfl: 5   levothyroxine  (SYNTHROID ) 75 MCG tablet, Take 1 tablet once daily 6 days a week, skip in Sundays, Disp: 78 tablet, Rfl: 3   liothyronine  (CYTOMEL ) 5 MCG tablet, 1 tablet 6 days a week, skip on Sundays, Disp: 78 tablet, Rfl: 3   NON FORMULARY, Take 1 tablet by mouth daily at 6 (six) AM. Sciatic, Disp: , Rfl:    NON FORMULARY, Take 1 tablet by mouth daily. Provitalize, Disp: , Rfl:   Current Facility-Administered Medications:    0.9 %  sodium chloride  infusion, 500 mL, Intravenous, Once, Nandigam, Kavitha V, MD  "

## 2024-01-22 LAB — STOOL CULTURE: E coli, Shiga toxin Assay: NEGATIVE

## 2024-01-23 ENCOUNTER — Telehealth: Payer: Self-pay

## 2024-01-23 ENCOUNTER — Ambulatory Visit: Admitting: Sports Medicine

## 2024-01-23 VITALS — HR 75 | Ht 59.0 in | Wt 167.0 lb

## 2024-01-23 DIAGNOSIS — M5441 Lumbago with sciatica, right side: Secondary | ICD-10-CM | POA: Diagnosis not present

## 2024-01-23 DIAGNOSIS — G8929 Other chronic pain: Secondary | ICD-10-CM

## 2024-01-23 DIAGNOSIS — M4316 Spondylolisthesis, lumbar region: Secondary | ICD-10-CM | POA: Diagnosis not present

## 2024-01-23 DIAGNOSIS — M7061 Trochanteric bursitis, right hip: Secondary | ICD-10-CM

## 2024-01-23 NOTE — Telephone Encounter (Signed)
 Pt called and notified of lab results    Copied from CRM #[id number]. Topic: Clinical - Lab/Test Results >> Jan 23, 2024  3:36 PM Antwanette L wrote: Reason for CRM: Pt is calling to obtain results from 3 home tests she completed and is requesting follow up. No provider notes are visible in the chart regarding these tests. Please reach out to pt when provider has left notes. Patient would like for us  to contact her this week after 2pm. Patient phone number is [phone].

## 2024-01-23 NOTE — Progress Notes (Signed)
 Patient advised.

## 2024-01-23 NOTE — Patient Instructions (Addendum)
 MRI lumbar  Tylenol [id number] mg 2-3 times a day for pain relief   Voltaren gel over areas of pain   Low back HEP   PT referral   Follow up 1 week after MRI to discuss results

## 2024-01-31 ENCOUNTER — Ambulatory Visit: Attending: Cardiology | Admitting: Cardiology

## 2024-01-31 ENCOUNTER — Ambulatory Visit (HOSPITAL_BASED_OUTPATIENT_CLINIC_OR_DEPARTMENT_OTHER)
Admission: RE | Admit: 2024-01-31 | Discharge: 2024-01-31 | Disposition: A | Discharge status: Home / Self Care | Payer: Self-pay | Source: Ambulatory Visit | Attending: Cardiology | Admitting: Cardiology

## 2024-01-31 ENCOUNTER — Encounter: Payer: Self-pay | Admitting: Cardiology

## 2024-01-31 VITALS — BP 126/86 | HR 86 | Ht 59.0 in | Wt 167.0 lb

## 2024-01-31 DIAGNOSIS — G453 Amaurosis fugax: Secondary | ICD-10-CM | POA: Insufficient documentation

## 2024-01-31 DIAGNOSIS — E039 Hypothyroidism, unspecified: Secondary | ICD-10-CM

## 2024-01-31 DIAGNOSIS — E7800 Pure hypercholesterolemia, unspecified: Secondary | ICD-10-CM | POA: Diagnosis not present

## 2024-01-31 DIAGNOSIS — E785 Hyperlipidemia, unspecified: Secondary | ICD-10-CM | POA: Insufficient documentation

## 2024-01-31 NOTE — Patient Instructions (Signed)
 Medication Instructions:  Your physician recommends that you continue on your current medications as directed. Please refer to the Current Medication list given to you today.  *If you need a refill on your cardiac medications before your next appointment, please call your pharmacy*   Lab Work: None Ordered If you have labs (blood work) drawn today and your tests are completely normal, you will receive your results only by: MyChart Message (if you have MyChart) OR A paper copy in the mail If you have any lab test that is abnormal or we need to change your treatment, we will call you to review the results.   Testing/Procedures: We will order CT coronary calcium score. It will cost $99.00 and is not covered by insurance.  Please call to schedule.    [facility] [address] [place], [place] [address] [phone]     Follow-Up: At [facility], you and your health needs are our priority.  As part of our continuing mission to provide you with exceptional heart care, we have created designated Provider Care Teams.  These Care Teams include your primary Cardiologist (physician) and Advanced Practice Providers (APPs -  Physician Assistants and Nurse Practitioners) who all work together to provide you with the care you need, when you need it.  We recommend signing up for the patient portal called MyChart.  Sign up information is provided on this After Visit Summary.  MyChart is used to connect with patients for Virtual Visits (Telemedicine).  Patients are able to view lab/test results, encounter notes, upcoming appointments, etc.  Non-urgent messages can be sent to your provider as well.   To learn more about what you can do with MyChart, go to [email or web address].    Your next appointment:   2 month(s)  The format for your next appointment:   In Person  Provider:   Lamar Fitch, MD    Other Instructions

## 2024-01-31 NOTE — Progress Notes (Signed)
 "  Cardiology Consultation:    Date:  01/31/2024   ID:  Karen Proctor, Karen Proctor 1950/03/28, MRN [id number]  PCP:  Dorina Loving, PA-C  Cardiologist:  Lamar Fitch, MD   Referring MD: [name], Edward, PA-C   No chief complaint on file. I had blindness my  History of Present Illness:    Karen Proctor is a 74 y.o. female who is being seen today for the evaluation of amaurosis fugax at the request of [name], Loving, PA-C.  Past medical history significant for fibromyalgia, metabolic syndrome, inflammatory arthritis, vitamin D3 deficiency, dyslipidemia, hypothyroidism.  She comes today to my office to be established as a patient.  Few months ago she had situation when she lost her vision in the lower portion of the left eye.  That sensation lasted for about 2 minutes and then completely disappeared she had up going to ophthalmologist who did all different testing nothing confirmed eventually she ended up going to the emergency room, she was eventually told that she got TIA, extensive evaluation has been done which include carotic CT which showed no significant stenosis, she also wore a Zio patch with did not show any atrial fibrillation, she also had echocardiogram which basically did not show an explanation for her potential amaurosis fugax.  She was seen by neurologist.  Overall she never had any heart trouble.  Denies have any chest pain tightness squeezing pressure burning chest.  She is to exercise on the regular basis but lately she does not do it.  She does have quite significant dyslipidemia she is afraid to take statin because she thinks can I risk her cortisol level which will make her lamination worse.  She is taking only Zetia  but last LDL was 153.  She is smoke only a long time ago and she was a teenager.  Does not have any family history of premature coronary artery disease.  No palpitations  Past Medical History:  Diagnosis Date   Adrenal insufficiency    Fibromyalgia     Hypothyroidism    Inflammatory arthritis 11/15/2013   Lead toxicity    Metabolic syndrome    Postmenopausal    Vitamin D  deficiency     Past Surgical History:  Procedure Laterality Date   APPENDECTOMY  1981   fibroid tumor     MYOMECTOMY  1993   OTHER SURGICAL HISTORY  1990   surgery for endometriosis and removal of fibroid tumor.   TOE SURGERY     TONSILLECTOMY  2009    Current Medications: Active Medications[1]   Allergies:   Patient has no known allergies.   Social History   Socioeconomic History   Marital status: Divorced    Spouse name: Not on file   Number of children: Not on file   Years of education: Not on file   Highest education level: Master's degree (e.g., MA, MS, MEng, MEd, MSW, MBA)  Occupational History   Occupation: Retired  Tobacco Use   Smoking status: Never   Smokeless tobacco: Never  Vaping Use   Vaping status: Never Used  Substance and Sexual Activity   Alcohol use: Yes    Alcohol/week: 0.0 standard drinks of alcohol    Comment: very occasionally   Drug use: No   Sexual activity: Not on file  Other Topics Concern   Not on file  Social History Narrative   Are you right handed or left handed? right   Are you currently employed ?    What is your current occupation?  [employer]   Do you live at home alone?yes   Who lives with you?    What type of home do you live in: 1 story or 2 story? one    Caffiene 1 cup a day   Social Drivers of Health   Tobacco Use: Low Risk (01/31/2024)   Patient History    Smoking Tobacco Use: Never    Smokeless Tobacco Use: Never    Passive Exposure: Not on file  Financial Resource Strain: Medium Risk (11/27/2023)   Overall Financial Resource Strain (CARDIA)    Difficulty of Paying Living Expenses: Somewhat hard  Food Insecurity: No Food Insecurity (11/27/2023)   Epic    Worried About Programme Researcher, Broadcasting/film/video in the Last Year: Never true    Ran Out of Food in the Last Year: Never true   Transportation Needs: No Transportation Needs (11/27/2023)   Epic    Lack of Transportation (Medical): No    Lack of Transportation (Non-Medical): No  Physical Activity: Insufficiently Active (11/27/2023)   Exercise Vital Sign    Days of Exercise per Week: 3 days    Minutes of Exercise per Session: 30 min  Stress: Stress Concern Present (11/27/2023)   [employer] of Occupational Health - Occupational Stress Questionnaire    Feeling of Stress: To some extent  Social Connections: Moderately Integrated (11/27/2023)   Social Connection and Isolation Panel    Frequency of Communication with Friends and Family: More than three times a week    Frequency of Social Gatherings with Friends and Family: Once a week    Attends Religious Services: More than 4 times per year    Active Member of Clubs or Organizations: Yes    Attends Banker Meetings: More than 4 times per year    Marital Status: Divorced  Depression (PHQ2-9): Low Risk (11/28/2023)   Depression (PHQ2-9)    PHQ-2 Score: 1  Alcohol Screen: Low Risk (11/27/2023)   Alcohol Screen    Last Alcohol Screening Score (AUDIT): 1  Housing: Low Risk (11/27/2023)   Epic    Unable to Pay for Housing in the Last Year: No    Number of Times Moved in the Last Year: 1    Homeless in the Last Year: No  Utilities: Not on file  Health Literacy: Not on file     Family History: The patient's family history includes COPD (age of onset: 53) in her mother; Skin cancer in her father. There is no history of Colon cancer, Esophageal cancer, Rectal cancer, Stomach cancer, or Thyroid  disease. ROS:   Please see the history of present illness.    All 14 point review of systems negative except as described per history of present illness.  EKGs/Labs/Other Studies Reviewed:    The following studies were reviewed today:   EKG:  EKG Interpretation Date/Time:  Tuesday January 31 2024 14:36:15 EST Ventricular Rate:  86 PR  Interval:  122 QRS Duration:  62 QT Interval:  378 QTC Calculation: 452 R Axis:   48  Text Interpretation: Normal sinus rhythm Nonspecific ST and T wave abnormality No previous ECGs available Confirmed by Bernie Charleston [phone]) on 01/31/2024 2:53:56 PM    Recent Labs: 11/16/2023: ALT 26; BUN 13; Creatinine, Ser 0.97; Hemoglobin 14.3; Platelets 210; Potassium 3.6; Sodium 139 01/13/2024: TSH 1.75  Recent Lipid Panel    Component Value Date/Time   CHOL 236 (H) 11/28/2023 1150   TRIG 144.0 11/28/2023 1150   HDL 54.10 11/28/2023 1150  CHOLHDL 4 11/28/2023 1150   VLDL 28.8 11/28/2023 1150   LDLCALC 153 (H) 11/28/2023 1150    Physical Exam:    VS:  BP 126/86   Pulse 86   Ht 4' 11 (1.499 m)   Wt 167 lb (75.8 kg)   SpO2 99%   BMI 33.73 kg/m     Wt Readings from Last 3 Encounters:  01/31/24 167 lb (75.8 kg)  01/23/24 167 lb (75.8 kg)  01/18/24 164 lb 6.4 oz (74.6 kg)     GEN:  Well nourished, well developed in no acute distress HEENT: Normal NECK: No JVD; No carotid bruits LYMPHATICS: No lymphadenopathy CARDIAC: RRR, no murmurs, no rubs, no gallops RESPIRATORY:  Clear to auscultation without rales, wheezing or rhonchi  ABDOMEN: Soft, non-tender, non-distended MUSCULOSKELETAL:  No edema; No deformity  SKIN: Warm and dry NEUROLOGIC:  Alert and oriented x 3 PSYCHIATRIC:  Normal affect   ASSESSMENT:    1. Hypercholesteremia   2. Amaurosis fugax   3. Acquired hypothyroidism   4. Dyslipidemia    PLAN:    In order of problems listed above:  History of amaurosis fugax.  So far evaluation is negative.  Echocardiogram negative angiogram of coronary arteries showing no significant stenosis in major vessels, she also wore a monitor for 2 weeks which shows some supraventricular tachycardia but no evidence of atrial fibrillation.  We had a long discussion about what to do with the situation I offer her potentially having implantable loop recorder to see if she got any atrial  fibrillation that could explain his symptomatology, she denies any decline, we did talk sometime about potentially getting Apple Watch which allowed her to be Any atrial fibrillation that she may have.  In the meantime we will continue antiplatelet therapy. Dyslipidemia obviously very concerning.  I think it would be beneficial for her to have calcium score done and based on that decide how aggressive we want to be with medications, she may require PCSK9 agent. Acquired hypothyroidism to be followed by primary care physician last TSH was 1.75 which is from 2 January of this year. Overall I encouraged her to be [place] more active.  She did sustain some injury to her left knee hopefully this will get better she will be able to exercise more   Medication Adjustments/Labs and Tests Ordered: Current medicines are reviewed at length with the patient today.  Concerns regarding medicines are outlined above.  Orders Placed This Encounter  Procedures   CT CARDIAC SCORING (SELF PAY ONLY)   EKG 12-Lead   No orders of the defined types were placed in this encounter.   Signed, Lamar DOROTHA Fitch, MD, [facility]. 01/31/2024 4:21 PM    [facility]    [1]  Current Meds  Medication Sig   aspirin EC 81 MG tablet Take 81 mg by mouth daily. Swallow whole.   ezetimibe  (ZETIA ) 10 MG tablet Take 1 tablet (10 mg total) by mouth daily.   levothyroxine  (SYNTHROID ) 75 MCG tablet Take 1 tablet once daily 6 days a week, skip in Sundays   liothyronine  (CYTOMEL ) 5 MCG tablet 1 tablet 6 days a week, skip on Sundays   NON FORMULARY Take 1 tablet by mouth daily at 6 (six) AM. Sciatic   NON FORMULARY Take 1 tablet by mouth daily. Provitalize   Current Facility-Administered Medications for the 01/31/24 encounter (Office Visit) with Francia Verry J, MD  Medication   0.9 %  sodium chloride  infusion   "

## 2024-02-01 ENCOUNTER — Ambulatory Visit: Payer: Self-pay | Admitting: Cardiology

## 2024-02-07 ENCOUNTER — Ambulatory Visit (HOSPITAL_BASED_OUTPATIENT_CLINIC_OR_DEPARTMENT_OTHER): Admission: RE | Admit: 2024-02-07 | Source: Ambulatory Visit

## 2024-02-14 ENCOUNTER — Ambulatory Visit (HOSPITAL_BASED_OUTPATIENT_CLINIC_OR_DEPARTMENT_OTHER)

## 2024-02-27 ENCOUNTER — Ambulatory Visit (HOSPITAL_BASED_OUTPATIENT_CLINIC_OR_DEPARTMENT_OTHER): Admitting: Physical Therapy

## 2024-02-28 ENCOUNTER — Ambulatory Visit (HOSPITAL_BASED_OUTPATIENT_CLINIC_OR_DEPARTMENT_OTHER)

## 2024-03-05 ENCOUNTER — Encounter (HOSPITAL_BASED_OUTPATIENT_CLINIC_OR_DEPARTMENT_OTHER): Admitting: Physical Therapy

## 2024-03-07 ENCOUNTER — Ambulatory Visit: Admitting: Neurology

## 2024-03-12 ENCOUNTER — Encounter (HOSPITAL_BASED_OUTPATIENT_CLINIC_OR_DEPARTMENT_OTHER): Admitting: Physical Therapy

## 2024-03-19 ENCOUNTER — Encounter (HOSPITAL_BASED_OUTPATIENT_CLINIC_OR_DEPARTMENT_OTHER): Admitting: Physical Therapy

## 2024-04-06 ENCOUNTER — Ambulatory Visit: Admitting: Cardiology
